# Patient Record
Sex: Male | Born: 1940
Health system: Southern US, Community
[De-identification: ages and names within clinical notes are randomized; demographics above are authoritative.]

## PROBLEM LIST (undated history)

## (undated) DIAGNOSIS — I493 Ventricular premature depolarization: Secondary | ICD-10-CM

## (undated) DIAGNOSIS — I10 Essential (primary) hypertension: Secondary | ICD-10-CM

## (undated) DIAGNOSIS — I2510 Atherosclerotic heart disease of native coronary artery without angina pectoris: Secondary | ICD-10-CM

## (undated) DIAGNOSIS — J15212 Pneumonia due to Methicillin resistant Staphylococcus aureus: Secondary | ICD-10-CM

## (undated) DIAGNOSIS — K635 Polyp of colon: Secondary | ICD-10-CM

## (undated) DIAGNOSIS — J449 Chronic obstructive pulmonary disease, unspecified: Secondary | ICD-10-CM

## (undated) DIAGNOSIS — E7800 Pure hypercholesterolemia, unspecified: Secondary | ICD-10-CM

## (undated) DIAGNOSIS — N400 Enlarged prostate without lower urinary tract symptoms: Secondary | ICD-10-CM

## (undated) HISTORY — DX: Benign prostatic hyperplasia without lower urinary tract symptoms: N40.0

## (undated) HISTORY — DX: Essential (primary) hypertension: I10

## (undated) HISTORY — DX: Pure hypercholesterolemia, unspecified: E78.00

## (undated) HISTORY — PX: CORONARY ANGIOPLASTY: SHX604

## (undated) HISTORY — DX: Ventricular premature depolarization: I49.3

## (undated) HISTORY — PX: CHOLECYSTECTOMY: SHX55

## (undated) HISTORY — DX: Chronic obstructive pulmonary disease, unspecified: J44.9

## (undated) HISTORY — DX: Atherosclerotic heart disease of native coronary artery without angina pectoris: I25.10

## (undated) HISTORY — DX: Pneumonia due to methicillin resistant Staphylococcus aureus: J15.212

## (undated) HISTORY — DX: Polyp of colon: K63.5

---

## 1999-04-23 ENCOUNTER — Other Ambulatory Visit: Admission: RE | Admit: 1999-04-23 | Discharge: 1999-04-23 | Payer: Self-pay | Admitting: Family Medicine

## 1999-06-05 ENCOUNTER — Ambulatory Visit (HOSPITAL_COMMUNITY): Admission: RE | Admit: 1999-06-05 | Discharge: 1999-06-05 | Payer: Self-pay | Admitting: Gastroenterology

## 1999-06-05 ENCOUNTER — Encounter (INDEPENDENT_AMBULATORY_CARE_PROVIDER_SITE_OTHER): Payer: Self-pay

## 2001-08-11 DIAGNOSIS — I2510 Atherosclerotic heart disease of native coronary artery without angina pectoris: Secondary | ICD-10-CM

## 2001-08-11 HISTORY — DX: Atherosclerotic heart disease of native coronary artery without angina pectoris: I25.10

## 2001-09-15 ENCOUNTER — Encounter: Payer: Self-pay | Admitting: Family Medicine

## 2001-09-15 ENCOUNTER — Encounter: Admission: RE | Admit: 2001-09-15 | Discharge: 2001-09-15 | Payer: Self-pay | Admitting: Family Medicine

## 2001-10-21 ENCOUNTER — Inpatient Hospital Stay (HOSPITAL_COMMUNITY): Admission: RE | Admit: 2001-10-21 | Discharge: 2001-10-23 | Payer: Self-pay | Admitting: *Deleted

## 2002-01-04 ENCOUNTER — Ambulatory Visit (HOSPITAL_COMMUNITY): Admission: RE | Admit: 2002-01-04 | Discharge: 2002-01-05 | Payer: Self-pay | Admitting: Cardiology

## 2002-08-02 ENCOUNTER — Inpatient Hospital Stay (HOSPITAL_COMMUNITY): Admission: AD | Admit: 2002-08-02 | Discharge: 2002-08-04 | Payer: Self-pay | Admitting: Internal Medicine

## 2002-08-02 ENCOUNTER — Encounter: Payer: Self-pay | Admitting: Internal Medicine

## 2004-04-08 ENCOUNTER — Ambulatory Visit (HOSPITAL_COMMUNITY): Admission: RE | Admit: 2004-04-08 | Discharge: 2004-04-08 | Payer: Self-pay | Admitting: Gastroenterology

## 2004-04-08 ENCOUNTER — Encounter (INDEPENDENT_AMBULATORY_CARE_PROVIDER_SITE_OTHER): Payer: Self-pay | Admitting: Specialist

## 2006-02-23 ENCOUNTER — Emergency Department (HOSPITAL_COMMUNITY): Admission: EM | Admit: 2006-02-23 | Discharge: 2006-02-24 | Payer: Self-pay | Admitting: Emergency Medicine

## 2006-06-11 ENCOUNTER — Inpatient Hospital Stay (HOSPITAL_COMMUNITY): Admission: EM | Admit: 2006-06-11 | Discharge: 2006-06-13 | Payer: Self-pay | Admitting: Emergency Medicine

## 2006-06-12 ENCOUNTER — Encounter (INDEPENDENT_AMBULATORY_CARE_PROVIDER_SITE_OTHER): Payer: Self-pay | Admitting: *Deleted

## 2007-04-06 ENCOUNTER — Ambulatory Visit: Payer: Self-pay | Admitting: Pulmonary Disease

## 2007-04-22 ENCOUNTER — Ambulatory Visit: Payer: Self-pay | Admitting: Internal Medicine

## 2008-10-15 ENCOUNTER — Inpatient Hospital Stay (HOSPITAL_COMMUNITY): Admission: EM | Admit: 2008-10-15 | Discharge: 2008-10-17 | Payer: Self-pay | Admitting: Emergency Medicine

## 2009-10-09 DIAGNOSIS — J15212 Pneumonia due to Methicillin resistant Staphylococcus aureus: Secondary | ICD-10-CM

## 2009-10-09 HISTORY — DX: Pneumonia due to methicillin resistant Staphylococcus aureus: J15.212

## 2009-10-16 ENCOUNTER — Inpatient Hospital Stay (HOSPITAL_COMMUNITY): Admission: EM | Admit: 2009-10-16 | Discharge: 2009-10-23 | Payer: Self-pay | Admitting: Emergency Medicine

## 2009-10-26 ENCOUNTER — Encounter: Admission: RE | Admit: 2009-10-26 | Discharge: 2009-10-26 | Payer: Self-pay | Admitting: Family Medicine

## 2009-10-30 ENCOUNTER — Inpatient Hospital Stay (HOSPITAL_COMMUNITY): Admission: EM | Admit: 2009-10-30 | Discharge: 2009-11-07 | Payer: Self-pay | Admitting: Emergency Medicine

## 2009-10-30 ENCOUNTER — Ambulatory Visit: Payer: Self-pay | Admitting: Internal Medicine

## 2009-10-31 ENCOUNTER — Encounter (INDEPENDENT_AMBULATORY_CARE_PROVIDER_SITE_OTHER): Payer: Self-pay | Admitting: Internal Medicine

## 2009-11-12 ENCOUNTER — Telehealth: Payer: Self-pay | Admitting: Pulmonary Disease

## 2009-11-12 ENCOUNTER — Ambulatory Visit: Payer: Self-pay | Admitting: Pulmonary Disease

## 2009-11-12 ENCOUNTER — Inpatient Hospital Stay (HOSPITAL_COMMUNITY): Admission: AD | Admit: 2009-11-12 | Discharge: 2009-11-24 | Payer: Self-pay | Admitting: Pulmonary Disease

## 2009-11-12 DIAGNOSIS — J930 Spontaneous tension pneumothorax: Secondary | ICD-10-CM

## 2009-11-12 DIAGNOSIS — J939 Pneumothorax, unspecified: Secondary | ICD-10-CM | POA: Insufficient documentation

## 2009-11-12 DIAGNOSIS — F17200 Nicotine dependence, unspecified, uncomplicated: Secondary | ICD-10-CM | POA: Insufficient documentation

## 2009-11-12 DIAGNOSIS — Z9981 Dependence on supplemental oxygen: Secondary | ICD-10-CM

## 2009-11-12 DIAGNOSIS — J1520 Pneumonia due to staphylococcus, unspecified: Secondary | ICD-10-CM | POA: Insufficient documentation

## 2009-11-12 DIAGNOSIS — J441 Chronic obstructive pulmonary disease with (acute) exacerbation: Secondary | ICD-10-CM | POA: Insufficient documentation

## 2009-11-14 ENCOUNTER — Ambulatory Visit: Payer: Self-pay | Admitting: Thoracic Surgery

## 2009-11-15 ENCOUNTER — Encounter: Payer: Self-pay | Admitting: Pulmonary Disease

## 2009-11-21 ENCOUNTER — Encounter: Payer: Self-pay | Admitting: Pulmonary Disease

## 2009-11-23 ENCOUNTER — Telehealth: Payer: Self-pay | Admitting: Pulmonary Disease

## 2009-11-29 ENCOUNTER — Encounter: Payer: Self-pay | Admitting: Pulmonary Disease

## 2009-11-29 ENCOUNTER — Ambulatory Visit: Payer: Self-pay | Admitting: Pulmonary Disease

## 2009-12-03 ENCOUNTER — Telehealth (INDEPENDENT_AMBULATORY_CARE_PROVIDER_SITE_OTHER): Payer: Self-pay | Admitting: *Deleted

## 2009-12-07 ENCOUNTER — Telehealth (INDEPENDENT_AMBULATORY_CARE_PROVIDER_SITE_OTHER): Payer: Self-pay | Admitting: *Deleted

## 2009-12-13 ENCOUNTER — Ambulatory Visit: Payer: Self-pay | Admitting: Pulmonary Disease

## 2009-12-28 ENCOUNTER — Ambulatory Visit: Payer: Self-pay | Admitting: Pulmonary Disease

## 2010-01-28 ENCOUNTER — Ambulatory Visit: Payer: Self-pay | Admitting: Pulmonary Disease

## 2010-01-28 ENCOUNTER — Encounter: Payer: Self-pay | Admitting: Pulmonary Disease

## 2010-01-29 ENCOUNTER — Telehealth (INDEPENDENT_AMBULATORY_CARE_PROVIDER_SITE_OTHER): Payer: Self-pay | Admitting: *Deleted

## 2010-02-25 ENCOUNTER — Ambulatory Visit: Payer: Self-pay | Admitting: Pulmonary Disease

## 2010-03-01 ENCOUNTER — Encounter: Payer: Self-pay | Admitting: Pulmonary Disease

## 2010-04-25 ENCOUNTER — Ambulatory Visit: Payer: Self-pay | Admitting: Pulmonary Disease

## 2010-06-24 ENCOUNTER — Ambulatory Visit: Payer: Self-pay | Admitting: Pulmonary Disease

## 2010-09-12 NOTE — Assessment & Plan Note (Signed)
Summary: 4 weeks/apc   Visit Type:  Follow-up Primary Provider/Referring Provider:  Ehinger  CC:  follow up, soB with activity, and pt states he has been having a low grade fever of 99.7.  History of Present Illness: 70 year old Caucasian male with history of coronary artery  disease who was  -readmitted to the hospital on March 22, 2011for persistent fever and cough.  Initially  discharged from the hospital a week prior to this admission with MRSA pneumonia.  Adm 3/22 -3/30/ 11 for Right upper lobe pneumonia (methicillin-resistant Staphylococcus  aureus).  MRSA 3/24 in sputum was S to clinda/ bactrim/ gent/ rifampin  Readm 4/4 -4/16 for rt pneumothorax & persistent air leak  November 29, 2009 1:39 PM  sutures removed. dyspnea at baseline. prefers his regimen of atrovent/ flovent & albuterol nebs to spiriva. CXR shows some improvement in RUl infx. Ct chest reviewed . compliant with O2  Dec 28, 2009 3:35 PM  better, temp as high as 99, clear phlegm, able to stay off oxygen for periods, chest tube site has healed.  Current Medications (verified): 1)  Atrovent Hfa 17 Mcg/act Aers (Ipratropium Bromide Hfa) .... Three Puffs Two Times A Day 2)  Flovent Hfa 44 Mcg/act Aero (Fluticasone Propionate  Hfa) .... Inhale 2 Puffs Two Times A Day 3)  Prevacid 15 Mg Cpdr (Lansoprazole) .... Take 1 Tablet By Mouth Once A Day (As Needed) 4)  Vytorin 10-40 Mg Tabs (Ezetimibe-Simvastatin) .... Take 1 Tablet By Mouth Once A Day 5)  Albuterol Sulfate (2.5 Mg/65ml) 0.083% Nebu (Albuterol Sulfate) .... Every 6 Hours As Needed  Allergies: 1)  ! Codeine  Past History:  Past Medical History: Last updated: 11/12/2009 1. CAD status post stenting in 2003.   2. Hypertension.   3. COPD.   4. History of cigarette smoking.   5. Hyperlipidemia  Past Surgical History: Last updated: 11/12/2009 Cholecystectomy  Social History: Last updated: 11/29/2009 Patient states former smoker.   Review of Systems    The patient complains of dyspnea on exertion.  The patient denies anorexia, fever, weight loss, weight gain, vision loss, decreased hearing, hoarseness, chest pain, syncope, peripheral edema, prolonged cough, headaches, hemoptysis, abdominal pain, melena, hematochezia, severe indigestion/heartburn, hematuria, muscle weakness, suspicious skin lesions, difficulty walking, depression, unusual weight change, and abnormal bleeding.    Vital Signs:  Patient profile:   70 year old male Height:      65.5 inches Weight:      124 pounds O2 Sat:      94 % on Room air Temp:     98.3 degrees F Pulse rate:   85 / minute BP sitting:   100 / 58  (left arm) Cuff size:   regular  Vitals Entered By: Mindy Silva(Dec 28, 2009 3:30 PM)  O2 Flow:  Room air  Serial Vital Signs/Assessments:  Comments: Ambulatory Pulse Oximetry  Resting; HR__92___    02 Sat__95%RA___  Lap1 (185 feet)   HR_112____   02 Sat__96%RA___ Lap2 (185 feet)   HR__114___   02 Sat__93%RA___    Lap3 (185 feet)   HR_110____   02 Sat__92%RA___  _X__Test Completed without Difficulty ___Test Stopped due to: Carver Fila SMA  Dec 28, 2009 3:55 PM       By: Carron Curie CMA   CC: follow up, soB with activity, pt states he has been having a low grade fever of 99.7 Comments Meds and allergies updated Carver Fila SMA  Dec 28, 2009 3:30 PM    Physical Exam  Additional  Exam:  Gen. Pleasant,thin, in no distress, normal affect ENT - no lesions, no post nasal drip Neck: No JVD, no thyromegaly, no carotid bruits Lungs: no use of accessory muscles, no dullness to percussion, decreased without rales or rhonchi  Cardiovascular: Rhythm regular, heart sounds  normal, no murmurs or gallops, no peripheral edema Musculoskeletal: No deformities, no cyanosis, clubbing ++      CXR  Procedure date:  12/28/2009  Findings:      IMPRESSION:   1.  Some decrease in airspace opacity in the right lower lobe. Right lung is otherwise  unchanged appearance. 2.  Emphysema. 3.  No new abnormality.  Impression & Recommendations:  Problem # 1:  SPONTANEOUS PNEUMOTHORAX (ICD-512.8)  -resolved  Orders: Est. Patient Level IV (56433)  Problem # 2:  MRSA PNEUMONIA (ICD-482.40)  - FU serial CXR q month to resolution OK to observe off abx now The following medications were removed from the medication list:    Zyvox 600 Mg Tabs (Linezolid) .Marland Kitchen... Take 1 tablet by mouth two times a day  Orders: Est. Patient Level IV (29518)  Problem # 3:  C O P D (ICD-496) ct flovent / atrovent decrease albuterlol neb usage  Problem # 4:  OXYGEN-USE OF SUPPLEMENTAL (ICD-V46.2)  dc daytime, ct nocturnal use  Orders: DME Referral (DME) Est. Patient Level IV (84166)  Medications Added to Medication List This Visit: 1)  Prevacid 15 Mg Cpdr (Lansoprazole) .... Take 1 tablet by mouth once a day (as needed)  Other Orders: T-2 View CXR (71020TC)  Patient Instructions: 1)  Copy sent to: Dr Manus Gunning 2)  Please schedule a follow-up appointment in 1 month with TP & chest x ray 3)  Get back on baby aspirin 4)  OK to stay off oxygen during daytime - continue to sleep with this at night

## 2010-09-12 NOTE — Procedures (Signed)
Summary: Oximetry/[facility]  Oximetry/[facility]   Imported By: Sherian Rein 03/08/2010 10:32:13  _____________________________________________________________________  External Attachment:    Type:   Image     Comment:   External Document

## 2010-09-12 NOTE — Assessment & Plan Note (Signed)
Summary: sob/jd   Visit Type:  Hospital Follow-up Primary Provider/Referring Provider:  Ehinger  CC:  Pt here for hospital follow up.  History of Present Illness: 70 year old Caucasian male with history of coronary artery  disease who was readmitted to the hospital on October 30, 2009, with  history of persistent fever and cough.  Patient had earlier been   discharged from the hospital a week prior to this admission after being  treated for MRSA pneumonia.  He was also said to have been given Bactrim  and Levaquin which patient was said to have used at home.  He, however,  represented with persistent cough and fever.  Fever was above 103   degrees and cough was said to be productive of rusty sputum.   Adm 3/22 -3/30/ 11 for Right upper lobe pneumonia (methicillin-resistant Staphylococcus  aureus).  MRSA 3/24 in sputum was S to clinda/ bactrim/ gent/ rifampin Steroid-induced hyperglycemia  chest on 3/22 showed 1. Worsening right upper lobe cavitary pneumonia with intra  parenchymal abscess formation as described.  2.  New relatively mild involvement of the lingula. Right middle  lobe infiltrate appears stable.  3.  Interval enlargement of small right pleural effusion.  4.  Stable probable reactive lymph nodes in the mediastinum and  right hilum.  November 12, 2009 3:28 PM c/o increased dyspne asince last night No more hemoptysis, Has quit smoking !! c/o increased dyspnea overnight & tremors. Using albuterol nebs q 6h, c/o dry mouth - Accompnaied by wife & dauughter, satn 93%RA, zyvox was expensive but able to afford  Current Medications (verified): 1)  Atrovent Hfa 17 Mcg/act Aers (Ipratropium Bromide Hfa) .... Three Puffs Two Times A Day 2)  Flovent Hfa 44 Mcg/act Aero (Fluticasone Propionate  Hfa) .... Inhale 2 Puffs Two Times A Day 3)  Prevacid 15 Mg Cpdr (Lansoprazole) .... Take 1 Tablet By Mouth Once A Day 4)  Vytorin 10-40 Mg Tabs (Ezetimibe-Simvastatin) .... Take 1 Tablet By Mouth Once A  Day 5)  Diflucan 100 Mg Tabs (Fluconazole) .... Take 1 Tablet By Mouth Once A Day 6)  Albuterol Sulfate (2.5 Mg/21ml) 0.083% Nebu (Albuterol Sulfate) .... Every 6 Hours As Needed 7)  Zyvox 600 Mg Tabs (Linezolid) .... Take 1 Tablet By Mouth Two Times A Day  Allergies (verified): 1)  ! Codeine  Past History:  Past Medical History: 1. CAD status post stenting in 2003.   2. Hypertension.   3. COPD.   4. History of cigarette smoking.   5. Hyperlipidemia  Past Surgical History: Cholecystectomy  Review of Systems       The patient complains of dyspnea on exertion.  The patient denies anorexia, fever, weight loss, weight gain, vision loss, decreased hearing, hoarseness, chest pain, syncope, peripheral edema, prolonged cough, headaches, hemoptysis, abdominal pain, melena, hematochezia, severe indigestion/heartburn, hematuria, muscle weakness, suspicious skin lesions, difficulty walking, depression, unusual weight change, and abnormal bleeding.    Vital Signs:  Patient profile:   70 year old male Height:      65.5 inches Weight:      117.13 pounds BMI:     19.26 O2 Sat:      95 % on 3 L/min Temp:     97.7 degrees F oral Pulse rate:   127 / minute BP sitting:   124 / 66  (left arm) Cuff size:   regular  Vitals Entered By: Zackery Barefoot CMA (November 12, 2009 2:53 PM)  O2 Flow:  3 L/min CC: Pt here  for hospital follow up Comments Medications reviewed with patient Verified contact number and pharmacy with patient Zackery Barefoot CMA  November 12, 2009 2:54 PM    Physical Exam  Additional Exam:  Gen. Pleasant,thin, in no distress, normal affect ENT - no lesions, no post nasal drip Neck: No JVD, no thyromegaly, no carotid bruits Lungs: no use of accessory muscles, no dullness to percussion, decreased without rales or rhonchi  Cardiovascular: Rhythm regular, heart sounds  normal, no murmurs or gallops, no peripheral edema Abdomen: soft and non-tender, no hepatosplenomegaly, BS  normal. Musculoskeletal: No deformities, no cyanosis, clubbing ++ Neuro:  alert, non focal, tremors +     CXR  Procedure date:  11/12/2009  Findings:      IMPRESSION:   1.  30% new right pneumothorax. 2.  Right pleural fluid with airspace opacity in the right upper lobe and right lower lobe. I suspect loculated fluid along the right lung apex.  Impression & Recommendations:  Problem # 1:  MRSA PNEUMONIA (ICD-482.40) Will treat with zyvox x 2 wks , got vanc x 1 wk in hospital ,  If cxr better , can then use clinda until resolution on CXR Discussed significance & prognosis with daughter His updated medication list for this problem includes:    Zyvox 600 Mg Tabs (Linezolid) .Marland Kitchen... Take 1 tablet by mouth two times a day  Orders: Est. Patient Level IV (99214) T-2 View CXR (71020TC)  Problem # 2:  TOBACCO ABUSE (ICD-305.1) nicotine patch - he prefers to inhaler Orders: Est. Patient Level IV (28413)  Problem # 3:  C O P D (ICD-496) ok to dc steroids spiriva in  future  Problem # 4:  OXYGEN-USE OF SUPPLEMENTAL (ICD-V46.2) ct nocturnal O2 & on exertion Orders: Est. Patient Level IV (24401)  Problem # 5:  SPONTANEOUS PNEUMOTHORAX (ICD-512.8) Admit for observation. If FU CXR shows worsening , will need chest tube Was able to convince him to get admitted after explaining consequences.  Medications Added to Medication List This Visit: 1)  Atrovent Hfa 17 Mcg/act Aers (Ipratropium bromide hfa) .... Three puffs two times a day 2)  Flovent Hfa 44 Mcg/act Aero (Fluticasone propionate  hfa) .... Inhale 2 puffs two times a day 3)  Prevacid 15 Mg Cpdr (Lansoprazole) .... Take 1 tablet by mouth once a day 4)  Vytorin 10-40 Mg Tabs (Ezetimibe-simvastatin) .... Take 1 tablet by mouth once a day 5)  Diflucan 100 Mg Tabs (Fluconazole) .... Take 1 tablet by mouth once a day 6)  Albuterol Sulfate (2.5 Mg/77ml) 0.083% Nebu (Albuterol sulfate) .... Every 6 hours as needed 7)  Zyvox 600 Mg  Tabs (Linezolid) .... Take 1 tablet by mouth two times a day  Patient Instructions: 1)  A chest x-ray has been recommended.  Your imaging study may require preauthorization.  2)  Copy sent to: 3)  Please schedule a follow-up appointment in 9 days with TP with CXR 4)  Use nicotine patch 14 - 21 mg once daily  5)  Use albuterol three times a day & a 4th time if needed 6)  OK to stay of prednisone 7)  You will likely need a longer duration of antibiotics

## 2010-09-12 NOTE — Progress Notes (Signed)
  Phone Note Outgoing Call   Call placed by: Comer Locket. Vassie Loll MD,  November 23, 2009 9:48 AM Summary of Call: received [facility] letter - saying they ccanot complete review of reason for admission. pleasae fax the last note/ admission H & P & XRay report to them at [phone] Initial call taken by: Comer Locket. Vassie Loll MD,  November 23, 2009 9:49 AM  Follow-up for Phone Call        Faxed. Zackery Barefoot CMA  November 23, 2009 5:28 PM

## 2010-09-12 NOTE — Miscellaneous (Signed)
Summary: Orders Update  Clinical Lists Changes  Orders: Added new Test order of T-2 View CXR (71020TC) - Signed 

## 2010-09-12 NOTE — Assessment & Plan Note (Signed)
Summary: f/u 2 months///kp   Visit Type:  Initial Consult Primary Provider/Referring Provider:  Ehinger  CC:  2 month follow up. Pt c/o increased SOB all  the time, non-productive cough, and chest pain and tightness.  History of Present Illness: 68/M with CAD for FU of COPD  Adm 3/22 -3/30/ 11 for Right upper lobe pneumonia (methicillin-resistant Staphylococcus  aureus).  MRSA 3/24 in sputum was S to clinda/ bactrim/ gent/ rifampin Readm 4/4 -4/16 for rt pneumothorax & persistent air leak -good resolution on FU imaging  June 24, 2010 10:58 AM  Quit smoking since feb '11. His wife & daughter continue to smoke chest tightness, not related to exertion, spont resolves in 4 mins, needs albuterol neb two times a day as needed , dr turner checked ekg  Denies chest pain, orthopnea, hemoptysis, fever, n/v/d, edema, headache,recent travel.   Preventive Screening-Counseling & Management  Alcohol-Tobacco     Smoking Status: quit     Packs/Day: 0.75     Year Started: 1961     Year Quit: 2011     Pack years: 55  Current Medications (verified): 1)  Atrovent Hfa 17 Mcg/act Aers (Ipratropium Bromide Hfa) .... Three Puffs Two Times A Day 2)  Flovent Hfa 44 Mcg/act Aero (Fluticasone Propionate  Hfa) .... Inhale 2 Puffs Two Times A Day 3)  Prevacid 15 Mg Cpdr (Lansoprazole) .... Take 1 Tablet By Mouth Once A Day (As Needed) 4)  Vytorin 10-40 Mg Tabs (Ezetimibe-Simvastatin) .... Take 1 Tablet By Mouth Once A Day 5)  Albuterol Sulfate (2.5 Mg/37ml) 0.083% Nebu (Albuterol Sulfate) .... Every 6 Hours As Needed 6)  Aspirin 81 Mg Tbec (Aspirin) .... Take 1 Tablet By Mouth Once A Day  Allergies (verified): 1)  ! Codeine  Past History:  Past Medical History: Last updated: 11/12/2009 1. CAD status post stenting in 2003.   2. Hypertension.   3. COPD.   4. History of cigarette smoking.   5. Hyperlipidemia  Social History: Last updated: 01/28/2010 former smoker, quit 10-09-2009 - x52yrs,  <1ppd no alcohol married 4 children retired: Facilities manager at [employer]  Review of Systems       The patient complains of dyspnea on exertion.  The patient denies anorexia, fever, weight loss, weight gain, vision loss, decreased hearing, hoarseness, chest pain, syncope, peripheral edema, prolonged cough, headaches, hemoptysis, abdominal pain, melena, hematochezia, severe indigestion/heartburn, hematuria, muscle weakness, suspicious skin lesions, transient blindness, difficulty walking, depression, unusual weight change, abnormal bleeding, enlarged lymph nodes, and angioedema.    Vital Signs:  Patient profile:   70 year old male Height:      65.5 inches Weight:      137.4 pounds BMI:     22.60 O2 Sat:      96 % on Room air Temp:     98.0 degrees F oral Pulse rate:   100 / minute BP sitting:   112 / 60  (left arm) Cuff size:   regular  Vitals Entered By: Zackery Barefoot CMA (June 24, 2010 10:40 AM)  O2 Flow:  Room air CC: 2 month follow up. Pt c/o increased SOB all  the time, non-productive cough, chest pain and tightness Comments Medications reviewed with patient Verified contact number and pharmacy with patient Zackery Barefoot CMA  June 24, 2010 10:40 AM    Physical Exam  Additional Exam:  wt 137 June 25, 2010  Gen. Pleasant,thin, in no distress, normal affect ENT - no lesions, no post nasal drip Neck:  No JVD, no thyromegaly, no carotid bruits Lungs: coarse BS w/ no wheezing.  Cardiovascular: Rhythm regular, heart sounds  normal, no murmurs or gallops, no peripheral edema Musculoskeletal: No deformities, no cyanosis, clubbing ++      Impression & Recommendations:  Problem # 1:  MRSA PNEUMONIA (ICD-482.40)  Appears resolved on last CXR with scarring  Orders: Est. Patient Level III (16109)  Problem # 2:  C O P D (ICD-496) stable  Trial of spiriva He does not want to strat pulm rehab  Patient Instructions: 1)  Copy sent to: dr  Manus Gunning 2)  Please schedule a follow-up appointment in 4 months with TP 3)  trial of spiriva qd   - USE instead of atrovent  - call me in 10 days & let me know if this is working 4)  Flu shot  5)  You would benefit from a pulmonary rehab program as we discussed. let me know if you a re interested

## 2010-09-12 NOTE — Letter (Signed)
Summary: Request for medical records/[facility]  Request for medical records/[facility]   Imported By: Sherian Rein 12/04/2009 10:36:30  _____________________________________________________________________  External Attachment:    Type:   Image     Comment:   External Document

## 2010-09-12 NOTE — Assessment & Plan Note (Signed)
Summary: NP follow up - PNA   Primary Provider/Referring Provider:  Manus Gunning  CC:  1 month follow up w/ cxr - states no new complaints today.Marland Kitchen  History of Present Illness: 70 year old Caucasian male with history of coronary artery  disease who was  -readmitted to the hospital on March 22, 2011for persistent fever and cough.  Initially  discharged from the hospital a week prior to this admission with MRSA pneumonia.  Adm 3/22 -3/30/ 11 for Right upper lobe pneumonia (methicillin-resistant Staphylococcus  aureus).  MRSA 3/24 in sputum was S to clinda/ bactrim/ gent/ rifampin  Readm 4/4 -4/16 for rt pneumothorax & persistent air leak  November 29, 2009 1:39 PM  sutures removed. dyspnea at baseline. prefers his regimen of atrovent/ flovent & albuterol nebs to spiriva. CXR shows some improvement in RUl infx. Ct chest reviewed . compliant with O2  Dec 28, 2009 3:35 PM  better, temp as high as 99, clear phlegm, able to stay off oxygen for periods, chest tube site has healed.  January 28, 2010--Returns for 1 month follow up w/ cxr. He feeling much better, he is getting stronger, close to his basline. Cough and congestion are almost totally gone.  CXR today shows a gradual decrease in size of RUL opacity but not totally resolved. His appetitie has improved and weight is slowly going up. Denies chest pain,  orthopnea, hemoptysis, fever, n/v/d, edema, headache.   Medications Prior to Update: 1)  Atrovent Hfa 17 Mcg/act Aers (Ipratropium Bromide Hfa) .... Three Puffs Two Times A Day 2)  Flovent Hfa 44 Mcg/act Aero (Fluticasone Propionate  Hfa) .... Inhale 2 Puffs Two Times A Day 3)  Prevacid 15 Mg Cpdr (Lansoprazole) .... Take 1 Tablet By Mouth Once A Day (As Needed) 4)  Vytorin 10-40 Mg Tabs (Ezetimibe-Simvastatin) .... Take 1 Tablet By Mouth Once A Day 5)  Albuterol Sulfate (2.5 Mg/49ml) 0.083% Nebu (Albuterol Sulfate) .... Every 6 Hours As Needed  Current Medications (verified): 1)  Atrovent Hfa 17  Mcg/act Aers (Ipratropium Bromide Hfa) .... Three Puffs Two Times A Day 2)  Flovent Hfa 44 Mcg/act Aero (Fluticasone Propionate  Hfa) .... Inhale 2 Puffs Two Times A Day 3)  Prevacid 15 Mg Cpdr (Lansoprazole) .... Take 1 Tablet By Mouth Once A Day (As Needed) 4)  Vytorin 10-40 Mg Tabs (Ezetimibe-Simvastatin) .... Take 1 Tablet By Mouth Once A Day 5)  Albuterol Sulfate (2.5 Mg/10ml) 0.083% Nebu (Albuterol Sulfate) .... Every 6 Hours As Needed 6)  Aspirin 81 Mg Tbec (Aspirin) .... Take 1 Tablet By Mouth Once A Day  Allergies (verified): 1)  ! Codeine  Past History:  Past Medical History: Last updated: 11/12/2009 1. CAD status post stenting in 2003.   2. Hypertension.   3. COPD.   4. History of cigarette smoking.   5. 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  Risk Factors: Smoking Status: quit (11/29/2009)  Family History: asthma - father rheumatism - father cancer - mother (breast)  Social History: former smoker, quit 10-09-2009 - x93yrs, <1ppd no alcohol married 4 children retired: Facilities manager at a [employer]  Review of Systems      See HPI  Vital Signs:  Patient profile:   70 year old male Height:  65.5 inches Weight:      128 pounds BMI:     21.05 O2 Sat:      96 % on Room air Temp:     98.2 degrees F oral Pulse rate:   75 / minute BP sitting:   106 / 60  (left arm) Cuff size:   regular  Vitals Entered By: Boone Master CNA/MA (January 28, 2010 3:27 PM)  O2 Flow:  Room air CC: 1 month follow up w/ cxr - states no new complaints today.   Physical Exam  Additional Exam:  Gen. Pleasant,thin, in no distress, normal affect ENT - no  lesions, no post nasal drip Neck: No JVD, no thyromegaly, no carotid bruits Lungs: no use of accessory muscles, no dullness to percussion, decreased without rales or rhonchi  Cardiovascular: Rhythm regular, heart sounds  normal, no murmurs or gallops, no peripheral edema Musculoskeletal: No deformities, no cyanosis, clubbing ++      Impression & Recommendations:  Problem # 1:  MRSA PNEUMONIA (ICD-482.40)  Clinically he is much improved and compensated on present regimen.  Xray w/ slow gradual decrease in RUL opacity, will need to follow up till clearance Xray reviewed by Dr. Vassie Loll , pt w/ follow up w/ him next month and repeat xray.   Orders: Est. Patient Level IV (14782)  Medications Added to Medication List This Visit: 1)  Aspirin 81 Mg Tbec (Aspirin) .... Take 1 tablet by mouth once a day  Complete Medication List: 1)  Atrovent Hfa 17 Mcg/act Aers (Ipratropium bromide hfa) .... Three puffs two times a day 2)  Flovent Hfa 44 Mcg/act Aero (Fluticasone propionate  hfa) .... Inhale 2 puffs two times a day 3)  Prevacid 15 Mg Cpdr (Lansoprazole) .... Take 1 tablet by mouth once a day (as needed) 4)  Vytorin 10-40 Mg Tabs (Ezetimibe-simvastatin) .... Take 1 tablet by mouth once a day 5)  Albuterol Sulfate (2.5 Mg/109ml) 0.083% Nebu (Albuterol sulfate) .... Every 6 hours as needed 6)  Aspirin 81 Mg Tbec (Aspirin) .... Take 1 tablet by mouth once a day  Patient Instructions: 1)  Continue on same meds.  2)  I will call with xray results.  3)  follow up Dr. Vassie Loll in 1 month and as needed    Immunization History:  Influenza Immunization History:    Influenza:  historical (05/12/2007)  Pneumovax Immunization History:    Pneumovax:  historical (05/12/2007)

## 2010-09-12 NOTE — Assessment & Plan Note (Signed)
Summary: per pete/post hospital/need chest xray/mhh   Primary Provider/Referring Provider:  Manus Gunning  CC:  Pt is here for a post hosp f/u appt.  Pt states breathing is "good."  Pt states occ cough with small amounts of "rust colored sputum."  .  History of Present Illness: 70 year old Caucasian male with history of coronary artery  disease who was  -readmitted to the hospital on March 22, 2011for persistent fever and cough.  Initially  discharged from the hospital a week prior to this admission with MRSA pneumonia.  Adm 3/22 -3/30/ 11 for Right upper lobe pneumonia (methicillin-resistant Staphylococcus  aureus).  MRSA 3/24 in sputum was S to clinda/ bactrim/ gent/ rifampin  Readm 4/4 -4/16 for rt pneumothorax & persistent air leak  November 29, 2009 1:39 PM  sutures removed. dyspnea at baseline. prefers his regimen of atrovent/ flovent & albuterol nebs to spiriva. CXR shows some improvement in RUl infx. Ct chest reviewed . compliant with O2  Preventive Screening-Counseling & Management  Alcohol-Tobacco     Smoking Status: quit  Current Medications (verified): 1)  Atrovent Hfa 17 Mcg/act Aers (Ipratropium Bromide Hfa) .... Three Puffs Two Times A Day 2)  Flovent Hfa 44 Mcg/act Aero (Fluticasone Propionate  Hfa) .... Inhale 2 Puffs Two Times A Day 3)  Prevacid 15 Mg Cpdr (Lansoprazole) .... Take 1 Tablet By Mouth Once A Day 4)  Vytorin 10-40 Mg Tabs (Ezetimibe-Simvastatin) .... Take 1 Tablet By Mouth Once A Day 5)  Albuterol Sulfate (2.5 Mg/21ml) 0.083% Nebu (Albuterol Sulfate) .... Every 6 Hours As Needed 6)  Zyvox 600 Mg Tabs (Linezolid) .... Take 1 Tablet By Mouth Two Times A Day  Allergies (verified): 1)  ! Codeine  Past History:  Past Medical History: Last updated: 11/12/2009 1. CAD status post stenting in 2003.   2. Hypertension.   3. COPD.   4. History of cigarette smoking.   5. Hyperlipidemia  Social History: Last updated: 11/29/2009 Patient states former smoker.    Social History: Patient states former smoker.  Smoking Status:  quit  Review of Systems       The patient complains of dyspnea on exertion.  The patient denies anorexia, fever, weight loss, weight gain, vision loss, decreased hearing, hoarseness, chest pain, syncope, peripheral edema, prolonged cough, headaches, hemoptysis, hematuria, muscle weakness, suspicious skin lesions, transient blindness, difficulty walking, depression, unusual weight change, abnormal bleeding, enlarged lymph nodes, angioedema, breast masses, and testicular masses.    Vital Signs:  Patient profile:   70 year old male Height:      65.5 inches Weight:      117.13 pounds O2 Sat:      95 % on 2 LPM pulsed Temp:     98.2 degrees F oral Pulse rate:   130 / minute BP sitting:   112 / 52  (left arm) Cuff size:   regular  Vitals Entered By: Arman Filter LPN (November 29, 2009 1:22 PM)  O2 Flow:  2 LPM pulsed CC: Pt is here for a post hosp f/u appt.  Pt states breathing is "good."  Pt states occ cough with small amounts of "rust colored sputum."   Comments Medications reviewed with patient Arman Filter LPN  November 29, 2009 1:22 PM    Physical Exam  Additional Exam:  Gen. Pleasant,thin, in no distress, normal affect ENT - no lesions, no post nasal drip Neck: No JVD, no thyromegaly, no carotid bruits Lungs: no use of accessory muscles, no dullness to percussion, decreased without rales or  rhonchi  Cardiovascular: Rhythm regular, heart sounds  normal, no murmurs or gallops, no peripheral edema Musculoskeletal: No deformities, no cyanosis, clubbing ++      Impression & Recommendations:  Problem # 1:  SPONTANEOUS PNEUMOTHORAX (ICD-512.8)  -resolved  Orders: Est. Patient Level IV (14782)  Problem # 2:  MRSA PNEUMONIA (ICD-482.40)  Has receive more than 6 wks total of mRSA therapy. Will stop in 1 wk & observe FU CXR  q 2 weeks until resolution - if worse or fevers, then use clinda for extended  duration His updated medication list for this problem includes:    Zyvox 600 Mg Tabs (Linezolid) .Marland Kitchen... Take 1 tablet by mouth two times a day  Orders: Est. Patient Level IV (95621)  Patient Instructions: 1)  Copy sent to: Dr Edwyna Shell, Dr Manus Gunning 2)  Please schedule a follow-up appointment in 1 month. 3)  A chest x-ray has been recommended in 2 weeks. Your imaging study may require preauthorization.  4)  OK to stop antibiotic when done

## 2010-09-12 NOTE — Progress Notes (Signed)
Summary: results  Phone Note Call from Patient   Caller: Patient Call For: parrett Summary of Call: calling for xray results Initial call taken by: Rickard Patience,  January 29, 2010 9:53 AM  Follow-up for Phone Call        lm with family member to call back Philipp Deputy [facility]  January 29, 2010 10:20 AM   per 01-29-10 append to to 01-28-10 cxr, pt advised of cxr results and verbalized his understanding.  pt has appt w/ RA 02-25-10 and will keep that appt. Boone Master CNA/MA  January 29, 2010 10:45 AM

## 2010-09-12 NOTE — Assessment & Plan Note (Signed)
Summary: NP follow up - PNA, COPD   Primary Provider/Referring Provider:  Ehinger  CC:  increased SOB, wheezing, prod cough with green/yellow mucus, sinus pressure/congestion w/ PND, and HA x2weeks - denies f/c/s.  History of Present Illness: 70 year old Caucasian male with history of coronary artery  disease who was  -readmitted to the hospital on March 22, 2011for persistent fever and cough.  Initially  discharged from the hospital a week prior to this admission with MRSA pneumonia.  Adm 3/22 -3/30/ 11 for Right upper lobe pneumonia (methicillin-resistant Staphylococcus  aureus).  MRSA 3/24 in sputum was S to clinda/ bactrim/ gent/ rifampin  Readm 4/4 -4/16 for rt pneumothorax & persistent air leak  February 25, 2010 3:02 PM  -Returns for 1 month follow . He feeling much better, he is getting stronger, close to his basline.  Off O2 . Cough and congestion are almost totally gone.  CXR 6/11 shows a gradual decrease in size of RUL opacity but not totally resolved. His appetitie has improved and weight is slowly going up. He  prefers his regimen of atrovent/ flovent & albuterol nebs to spiriva. Remains off cigs. Desaturated to 92% on walking 3 laps today CXR -scarring RUL, continues to shrink  April 25, 2010 --Returns for follow up and xray. Also complains of increased SOB, wheezing, prod cough with green/yellow mucus, sinus pressure/congestion w/ PND, HA x2weeks . OTC not helping.  was doing better until last couple of weeks. Denies chest pain, orthopnea, hemoptysis, fever, n/v/d, edema, headache,recent travel. XRay today shows RUL scarring is unchanged. no acute changes.   Anticoagulation Management History:      Positive risk factors for bleeding include an age of 46 years or older.  The bleeding index is 'intermediate risk'.  Negative CHADS2 values include Age > 19 years old.    Medications Prior to Update: 1)  Atrovent Hfa 17 Mcg/act Aers (Ipratropium Bromide Hfa) .... Three Puffs Two  Times A Day 2)  Flovent Hfa 44 Mcg/act Aero (Fluticasone Propionate  Hfa) .... Inhale 2 Puffs Two Times A Day 3)  Prevacid 15 Mg Cpdr (Lansoprazole) .... Take 1 Tablet By Mouth Once A Day (As Needed) 4)  Vytorin 10-40 Mg Tabs (Ezetimibe-Simvastatin) .... Take 1 Tablet By Mouth Once A Day 5)  Albuterol Sulfate (2.5 Mg/91ml) 0.083% Nebu (Albuterol Sulfate) .... Every 6 Hours As Needed 6)  Aspirin 81 Mg Tbec (Aspirin) .... Take 1 Tablet By Mouth Once A Day  Current Medications (verified): 1)  Atrovent Hfa 17 Mcg/act Aers (Ipratropium Bromide Hfa) .... Three Puffs Two Times A Day 2)  Flovent Hfa 44 Mcg/act Aero (Fluticasone Propionate  Hfa) .... Inhale 2 Puffs Two Times A Day 3)  Prevacid 15 Mg Cpdr (Lansoprazole) .... Take 1 Tablet By Mouth Once A Day (As Needed) 4)  Vytorin 10-40 Mg Tabs (Ezetimibe-Simvastatin) .... Take 1 Tablet By Mouth Once A Day 5)  Albuterol Sulfate (2.5 Mg/36ml) 0.083% Nebu (Albuterol Sulfate) .... Every 6 Hours As Needed 6)  Aspirin 81 Mg Tbec (Aspirin) .... Take 1 Tablet By Mouth Once A Day  Allergies (verified): 1)  ! Codeine  Past History:  Past Medical History: Last updated: 11/12/2009 1. CAD status post stenting in 2003.   2. Hypertension.   3. COPD.   4. History of cigarette smoking.   5. 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[employer]  Risk Factors: Smoking Status: quit (02/25/2010) Packs/Day: 0.75 (02/25/2010)  Review of Systems      See HPI  Vital Signs:  Patient profile:   71 year old male Height:      65.5 inches Weight:      131.25 pounds BMI:     21.59 O2 Sat:      94 % on Room air Temp:     97.9 degrees F oral Pulse rate:   71 / minute BP sitting:   122 / 68  (left arm) Cuff size:    regular  Vitals Entered By: Boone Master CNA/MA (April 25, 2010 2:55 PM)  O2 Flow:  Room air CC: increased SOB, wheezing, prod cough with green/yellow mucus, sinus pressure/congestion w/ PND, HA x2weeks - denies f/c/s Is Patient Diabetic? No Comments Medications reviewed with patient Daytime contact number verified with patient. Boone Master CNA/MA  April 25, 2010 2:56 PM    Physical Exam  Additional Exam:  Gen. Pleasant,thin, in no distress, normal affect ENT - no lesions, no post nasal drip Neck: No JVD, no thyromegaly, no carotid bruits Lungs: coarse BS w/ no wheezing.  Cardiovascular: Rhythm regular, heart sounds  normal, no murmurs or gallops, no peripheral edema Musculoskeletal: No deformities, no cyanosis, clubbing ++      Impression & Recommendations:  Problem # 1:  C O P D (ICD-496)  RUL scarring unchanged on xray today , no acute findings.  Mild flare of COPD w/ associated bronchitis.  PLan:  Doxcycline 100mg  two times a day for 7 days  Mucinex DM two times a day as needed cough/congestion  May get flu shot in 1 week.  Please contact office for sooner follow up if symptoms do not improve or worsen  follow up Dr. Vassie Loll in 2 months and as needed   Orders: Est. Patient Level IV (04540)  Medications Added to Medication List This Visit: 1)  Doxycycline Hyclate 100 Mg Caps (Doxycycline hyclate) .Marland Kitchen.. 1 by mouth two times a day  Complete Medication List: 1)  Atrovent Hfa 17 Mcg/act Aers (Ipratropium bromide hfa) .... Three puffs two times a day 2)  Flovent Hfa 44 Mcg/act Aero (Fluticasone propionate  hfa) .... Inhale 2 puffs two times a day 3)  Prevacid 15 Mg Cpdr (Lansoprazole) .... Take 1 tablet by mouth once a day (as needed) 4)  Vytorin 10-40 Mg Tabs (Ezetimibe-simvastatin) .... Take 1 tablet by mouth once a day 5)  Albuterol Sulfate (2.5 Mg/75ml) 0.083% Nebu (Albuterol sulfate) .... Every 6 hours as needed 6)  Aspirin 81 Mg Tbec (Aspirin) .... Take 1  tablet by mouth once a day 7)  Doxycycline Hyclate 100 Mg Caps (Doxycycline hyclate) .Marland Kitchen.. 1 by mouth two times a day  Other Orders: T-2 View CXR (71020TC)   Patient Instructions: 1)  Doxcycline 100mg  two times a day for 7 days  2)  Mucinex DM two times a day as needed cough/congestion  3)  May get flu shot in 1 week.  4)  Please contact office for sooner follow up if symptoms do not improve or worsen  5)  follow up Dr. Vassie Loll in 2 months and as needed  Prescriptions: DOXYCYCLINE HYCLATE 100 MG CAPS (DOXYCYCLINE HYCLATE) 1 by mouth two times a day  #14 x 0   Entered and Authorized by:   Rubye Oaks NP   Signed by:   Tammy Parrett NP on 04/25/2010   Method used:  Print then Give to Patient   RxID:   [id number]

## 2010-09-12 NOTE — Progress Notes (Signed)
Summary: sob  Phone Note Call from Patient   Caller: Daughter Efraim Kaufmann Call For: alva Summary of Call: pt have appt on friday having sob using albuterol Initial call taken by: Rickard Patience,  November 12, 2009 9:54 AM  Follow-up for Phone Call        [facility] to offer appt today with RA. Carron Curie CMA  November 12, 2009 10:00 AM     Additional Follow-up for Phone Call Additional follow up Details #2::    Scheduled appt with RA for 2:45p today. Follow-up by: Darletta Moll,  November 12, 2009 11:20 AM

## 2010-09-12 NOTE — Letter (Signed)
Summary: Request for medical records/[employer]  Request for medical records/[employer]   Imported By: Sherian Rein 12/04/2009 10:37:42  _____________________________________________________________________  External Attachment:    Type:   Image     Comment:   External Document

## 2010-09-12 NOTE — Assessment & Plan Note (Signed)
Summary: rov 1 month ///kp   Visit Type:  Follow-up Primary Provider/Referring Provider:  Manus Gunning  CC:  Pt here for follow up. Pt states breathing is worse. Pt c/o increased SOB with activity .  History of Present Illness: 70 year old Caucasian male with history of coronary artery  disease who was  -readmitted to the hospital on March 22, 2011for persistent fever and cough.  Initially  discharged from the hospital a week prior to this admission with MRSA pneumonia.  Adm 3/22 -3/30/ 11 for Right upper lobe pneumonia (methicillin-resistant Staphylococcus  aureus).  MRSA 3/24 in sputum was S to clinda/ bactrim/ gent/ rifampin  Readm 4/4 -4/16 for rt pneumothorax & persistent air leak  February 25, 2010 3:02 PM  -Returns for 1 month follow . He feeling much better, he is getting stronger, close to his basline.  Off O2 . Cough and congestion are almost totally gone.  CXR 6/11 shows a gradual decrease in size of RUL opacity but not totally resolved. His appetitie has improved and weight is slowly going up. He  prefers his regimen of atrovent/ flovent & albuterol nebs to spiriva. Remains off cigs. Desaturated to 92% on walking 3 laps today CXR -scarring RUL, continues to shrink  Denies chest pain,  orthopnea, hemoptysis, fever, n/v/d, edema, headache.   Preventive Screening-Counseling & Management  Alcohol-Tobacco     Smoking Status: quit     Packs/Day: 0.75     Year Started: 1961     Year Quit: 2011     Pack years: 76  Current Medications (verified): 1)  Atrovent Hfa 17 Mcg/act Aers (Ipratropium Bromide Hfa) .... Three Puffs Two Times A Day 2)  Flovent Hfa 44 Mcg/act Aero (Fluticasone Propionate  Hfa) .... Inhale 2 Puffs Two Times A Day 3)  Prevacid 15 Mg Cpdr (Lansoprazole) .... Take 1 Tablet By Mouth Once A Day (As Needed) 4)  Vytorin 10-40 Mg Tabs (Ezetimibe-Simvastatin) .... Take 1 Tablet By Mouth Once A Day 5)  Albuterol Sulfate (2.5 Mg/19ml) 0.083% Nebu (Albuterol Sulfate) .... Every  6 Hours As Needed 6)  Aspirin 81 Mg Tbec (Aspirin) .... Take 1 Tablet By Mouth Once A Day  Allergies (verified): 1)  ! Codeine  Past History:  Past Medical History: Last updated: 11/12/2009 1. CAD status post stenting in 2003.   2. Hypertension.   3. COPD.   4. History of cigarette smoking.   5. 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  The patient denies anorexia, fever, weight loss, weight gain, vision loss, decreased hearing, hoarseness, chest pain, syncope, peripheral edema, prolonged cough, headaches, hemoptysis, abdominal pain, melena, hematochezia, severe indigestion/heartburn, hematuria, muscle weakness, suspicious skin lesions, transient blindness, difficulty walking, depression, unusual weight change, and abnormal bleeding.    Vital Signs:  Patient profile:   70 year old male Height:      65.5 inches Weight:      132 pounds BMI:     21.71 O2 Sat:      95 % on Room air Temp:     98.1 degrees F oral Pulse rate:   93 / minute BP sitting:   110 / 60  (left arm) Cuff size:  regular  Vitals Entered By: Zackery Barefoot CMA (February 25, 2010 2:54 PM)  O2 Flow:  Room air  Serial Vital Signs/Assessments:  Comments: 3:37 PM Ambulatory Pulse Oximetry  Resting; HR___99__    02 Sat__93% on room air___  Lap1 (185 feet)   HR___102__   02 Sat__94% on room air___ Lap2 (185 feet)   HR__105___   02 Sat__93% on room air___    Lap3 (185 feet)   HR_104____   02 Sat__92% on room air___  _x__Test Completed without Difficulty ___Test Stopped due to:    By: Michel Bickers CMA   CC: Pt here for follow up. Pt states breathing is worse.  Pt c/o increased SOB with activity  Comments Medications reviewed with patient Verified contact number and pharmacy with patient Zackery Barefoot CMA  February 25, 2010 2:54 PM    Physical Exam  Additional Exam:  Gen. Pleasant,thin, in no distress, normal affect ENT - no lesions, no post nasal drip Neck: No JVD, no thyromegaly, no carotid bruits Lungs: no use of accessory muscles, no dullness to percussion, decreased without rales or rhonchi  Cardiovascular: Rhythm regular, heart sounds  normal, no murmurs or gallops, no peripheral edema Musculoskeletal: No deformities, no cyanosis, clubbing ++      CXR  Procedure date:  02/25/2010  Findings:      Comparison: 01/28/2010   Findings: The cardiac silhouette, mediastinal and hilar contours are stable.  There are severe chronic lung changes/COPD with persistent right apical density.  This is likely scarring change. No new/acute pulmonary findings.  The bony thorax is intact.   IMPRESSION: Chronic lung changes/COPD without definite acute pulmonary findings.  Impression & Recommendations:  Problem # 1:  MRSA PNEUMONIA (ICD-482.40) -gradually resolving CXR today Orders: Est. Patient Level III (99213) T-2 View CXR (71020TC)  Problem # 2:  OXYGEN-USE OF SUPPLEMENTAL (ICD-V46.2) chk ONO , if no significant desaturation, can dc O2 Orders: DME Referral (DME)  Problem # 3:  C O P D (ICD-496) He  prefers his regimen of atrovent/ flovent & albuterol nebs to spiriva.  spirometry in the future once pna completely resolved.  Patient Instructions: 1)  Copy sent to: Ehinger 2)  Please schedule a follow-up appointment in 2 months with TP with CXR 3)  A chest x-ray has been recommended.  Your imaging study may require preauthorization.  4)  Take claritin or ZYRTEC OTC for nasal stuffiness    Appended Document: rov 1 month ///kp ONO results received and placed in Dr. Reginia Naas "look at". jwr  Appended Document: Orders Update no  significant desaturation, dc O2   Clinical Lists Changes  Orders: Added new Referral order of DME Referral (DME) - Signed      Appended Document: rov 1 month ///kp Order faxed to [facility] to D/C o2.

## 2010-09-12 NOTE — Progress Notes (Signed)
Summary: sick  Phone Note Call from Patient Call back at Home Phone [phone]   Caller: Daughter-Jorge [name] Call For: Jorge [name] Reason for Call: Talk to Nurse Summary of Call: pt has 3 days worth of antibiotics left, still has severe nausea, some vomiting, no fever, extreme cough, drainage from allergies.  Don't want him to come off antibiotic, until cleared up, please call and let daughter know what to do. Initial call taken by: Jorge [name],  December 03, 2009 9:20 AM  Follow-up for Phone Call        pt only has 3 days of the antibiotic left and daughter would like to know if it would be ok for pt to d/c since he has been taking for 6 weeks, daughter states the nausea and vomiting are getting worse pt is vomiting at least twice in a day---pls advise Follow-up by: Philipp Deputy CMA,  December 03, 2009 10:55 AM  Additional Follow-up for Phone Call Additional follow up Details #1::        Ok to stop antibiotic & call back in 3 days Additional Follow-up by: Comer Locket. Jorge Loll MD,  December 03, 2009 12:51 PM    Additional Follow-up for Phone Call Additional follow up Details #2::    pt's daughter aware to d/c antibiotic and call back in 3 days with update on pt Follow-up by: Philipp Deputy CMA,  December 03, 2009 1:32 PM

## 2010-09-12 NOTE — Progress Notes (Signed)
Summary: cxr  Phone Note Call from Patient   Caller: Daughter Call For: alva Summary of Call: per daughter Efraim Kaufmann Crumm: pt stopped abx (per dr Reginia Naas instructions) on monday. daughter says that pt's nausea has gone away. also says that pt's lungs are "clear" per home health nurse. daughter wants to know if pt will still need to have cxr that's schedule on 5/5 or should this date be changed? daughter says that dr Vassie Loll wanted pt to be off abx a certain amount of time. call melissa at [phone] Initial call taken by: Tivis Ringer, CNA,  December 07, 2009 12:38 PM  Follow-up for Phone Call        Pls advise if pt will still need cxr.  Follow-up by: Vernie Murders,  December 07, 2009 1:13 PM  Additional Follow-up for Phone Call Additional follow up Details #1::        yes - to make sure no worsening off ABx Additional Follow-up by: Comer Locket. Vassie Loll MD,  December 07, 2009 4:34 PM    Additional Follow-up for Phone Call Additional follow up Details #2::    Melissa informed pt should still have cxr to make sure sxs are no worse off abx and she had verbal understanding of this.Michel Bickers [facility]  December 07, 2009 4:39 PM

## 2010-10-24 ENCOUNTER — Encounter: Payer: Self-pay | Admitting: Adult Health

## 2010-10-24 ENCOUNTER — Ambulatory Visit (INDEPENDENT_AMBULATORY_CARE_PROVIDER_SITE_OTHER): Payer: Medicare Other | Admitting: Adult Health

## 2010-10-24 DIAGNOSIS — J449 Chronic obstructive pulmonary disease, unspecified: Secondary | ICD-10-CM

## 2010-10-29 NOTE — Assessment & Plan Note (Signed)
Summary: NP follow up - COPD   Primary Provider/Referring Provider:  Manus Gunning  CC:  4 month follow up - states breathing is "about the same."  reports no change in breathing with the spiriva.  History of Present Illness: 68/M with CAD for FU of COPD  Adm 3/22 -3/30/ 11 for Right upper lobe pneumonia (methicillin-resistant Staphylococcus  aureus).  MRSA 3/24 in sputum was S to clinda/ bactrim/ gent/ rifampin Readm 4/4 -4/16 for rt pneumothorax & persistent air leak -good resolution on FU imaging  June 24, 2010  Quit smoking since feb '11. His wife & daughter continue to smoke chest tightness, not related to exertion, spont resolves in 4 mins, needs albuterol neb two times a day as needed , dr turner checked ekg >>Spiriva trial --like atrovent better  xr>Stable COPD and asymmetric right apical pleural - parenchymal scarring.  No acute findings.  October 24, 2010--4 month follow up - states breathing is "about the same."  reports no change in breathing with the spiriva. Wears out at times but tries to stay active. Mows grass x witih push mower and has small garden.  Denies chest pain, orthopnea, hemoptysis, fever, n/v/d, edema, headache.   Medications Prior to Update: 1)  Atrovent Hfa 17 Mcg/act Aers (Ipratropium Bromide Hfa) .... Three Puffs Two Times A Day 2)  Flovent Hfa 44 Mcg/act Aero (Fluticasone Propionate  Hfa) .... Inhale 2 Puffs Two Times A Day 3)  Prevacid 15 Mg Cpdr (Lansoprazole) .... Take 1 Tablet By Mouth Once A Day (As Needed) 4)  Vytorin 10-40 Mg Tabs (Ezetimibe-Simvastatin) .... Take 1 Tablet By Mouth Once A Day 5)  Albuterol Sulfate (2.5 Mg/79ml) 0.083% Nebu (Albuterol Sulfate) .... Every 6 Hours As Needed 6)  Aspirin 81 Mg Tbec (Aspirin) .... Take 1 Tablet By Mouth Once A Day  Current Medications (verified): 1)  Atrovent Hfa 17 Mcg/act Aers (Ipratropium Bromide Hfa) .... Three Puffs Two Times A Day 2)  Flovent Hfa 44 Mcg/act Aero (Fluticasone Propionate  Hfa)  .... Inhale 2 Puffs Two Times A Day 3)  Prevacid 15 Mg Cpdr (Lansoprazole) .... Take 1 Tablet By Mouth Once A Day (As Needed) 4)  Vytorin 10-40 Mg Tabs (Ezetimibe-Simvastatin) .... Take 1 Tablet By Mouth Once A Day 5)  Albuterol Sulfate (2.5 Mg/61ml) 0.083% Nebu (Albuterol Sulfate) .... Every 6 Hours As Needed 6)  Aspirin 81 Mg Tbec (Aspirin) .... Take 1 Tablet By Mouth Once A Day  Allergies (verified): 1)  ! 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 CAD status post stenting in 2003.   2. Hypertension.   3. COPD.  ---Spiriva trial --like atrovent better 06/2011    4. History of cigarette smoking.   5. Hyperlipidemia  Vital Signs:  Patient profile:   70 year old male Height:      65.5 inches Weight:      145 pounds BMI:     23.85 O2 Sat:      94 % on Room air Temp:     97.5 degrees F oral Pulse rate:   80 / minute BP sitting:   128 / 72  (right arm) Cuff  size:   regular  Vitals Entered By: Boone Master CNA/MA (October 24, 2010 2:23 PM)  O2 Flow:  Room air CC: 4 month follow up - states breathing is "about the same."  reports no change in breathing with the spiriva Is Patient Diabetic? No Comments Medications reviewed with patient Daytime contact number verified with patient. Boone Master CNA/MA  October 24, 2010 2:24 PM    Physical Exam  Additional Exam:  wt 137 June 25, 2010 >>145 October 24, 2010  Gen. Pleasant,thin, in no distress, normal affect ENT - no lesions, no post nasal drip Neck: No JVD, no thyromegaly, no carotid bruits Lungs:  coarse BS w/ no wheezing.  Cardiovascular: Rhythm regular, heart sounds  normal, no murmurs or gallops, no peripheral edema Musculoskeletal: No deformities, no cyanosis, clubbing ++      Impression & Recommendations:  Problem # 1:  C O P D (ICD-496) Compensated on present regimen.  No significant improvement with spiriva per pt, will cont on atrovent and flovent  discussed pulm rehab and activity level.   Other Orders: Est. Patient Level III ([address])  Patient Instructions: 1)  Continue on same meds  2)  follow up Dr. Vassie Loll in 4 months and as needed

## 2010-10-30 LAB — COMPREHENSIVE METABOLIC PANEL
ALT: 33 U/L (ref 0–53)
AST: 25 U/L (ref 0–37)
BUN: 12 mg/dL (ref 6–23)
Potassium: 3.9 mEq/L (ref 3.5–5.1)
Sodium: 135 mEq/L (ref 135–145)
Total Bilirubin: 0.8 mg/dL (ref 0.3–1.2)
Total Protein: 5.9 g/dL — ABNORMAL LOW (ref 6.0–8.3)

## 2010-10-30 LAB — CBC
HCT: 27.4 % — ABNORMAL LOW (ref 39.0–52.0)
HCT: 29 % — ABNORMAL LOW (ref 39.0–52.0)
HCT: 36.7 % — ABNORMAL LOW (ref 39.0–52.0)
Hemoglobin: 12.1 g/dL — ABNORMAL LOW (ref 13.0–17.0)
Hemoglobin: 9.3 g/dL — ABNORMAL LOW (ref 13.0–17.0)
MCHC: 32.8 g/dL (ref 30.0–36.0)
MCHC: 34 g/dL (ref 30.0–36.0)
MCV: 92.9 fL (ref 78.0–100.0)
MCV: 93.4 fL (ref 78.0–100.0)
Platelets: 145 10*3/uL — ABNORMAL LOW (ref 150–400)
Platelets: 175 10*3/uL (ref 150–400)
Platelets: 526 10*3/uL — ABNORMAL HIGH (ref 150–400)
RBC: 2.95 MIL/uL — ABNORMAL LOW (ref 4.22–5.81)
RBC: 3.1 MIL/uL — ABNORMAL LOW (ref 4.22–5.81)
RBC: 3.94 MIL/uL — ABNORMAL LOW (ref 4.22–5.81)
RDW: 13.5 % (ref 11.5–15.5)
RDW: 13.6 % (ref 11.5–15.5)
WBC: 10.6 10*3/uL — ABNORMAL HIGH (ref 4.0–10.5)
WBC: 5.5 10*3/uL (ref 4.0–10.5)

## 2010-10-30 LAB — BASIC METABOLIC PANEL
CO2: 31 mEq/L (ref 19–32)
Calcium: 8.2 mg/dL — ABNORMAL LOW (ref 8.4–10.5)
Chloride: 97 mEq/L (ref 96–112)
Creatinine, Ser: 0.76 mg/dL (ref 0.4–1.5)
Creatinine, Ser: 0.77 mg/dL (ref 0.4–1.5)
Glucose, Bld: 104 mg/dL — ABNORMAL HIGH (ref 70–99)
Glucose, Bld: 130 mg/dL — ABNORMAL HIGH (ref 70–99)
Sodium: 134 mEq/L — ABNORMAL LOW (ref 135–145)

## 2010-10-30 LAB — PROTIME-INR: Prothrombin Time: 14.1 seconds (ref 11.6–15.2)

## 2010-11-03 LAB — CBC
HCT: 31.8 % — ABNORMAL LOW (ref 39.0–52.0)
HCT: 33.5 % — ABNORMAL LOW (ref 39.0–52.0)
HCT: 37.4 % — ABNORMAL LOW (ref 39.0–52.0)
HCT: 37.5 % — ABNORMAL LOW (ref 39.0–52.0)
HCT: 38.2 % — ABNORMAL LOW (ref 39.0–52.0)
HCT: 41.6 % (ref 39.0–52.0)
HCT: 46.5 % (ref 39.0–52.0)
Hemoglobin: 10 g/dL — ABNORMAL LOW (ref 13.0–17.0)
Hemoglobin: 11.3 g/dL — ABNORMAL LOW (ref 13.0–17.0)
Hemoglobin: 12.2 g/dL — ABNORMAL LOW (ref 13.0–17.0)
Hemoglobin: 13.2 g/dL (ref 13.0–17.0)
Hemoglobin: 13.3 g/dL (ref 13.0–17.0)
Hemoglobin: 15 g/dL (ref 13.0–17.0)
Hemoglobin: 9.8 g/dL — ABNORMAL LOW (ref 13.0–17.0)
MCHC: 31.9 g/dL (ref 30.0–36.0)
MCHC: 32.1 g/dL (ref 30.0–36.0)
MCHC: 32.2 g/dL (ref 30.0–36.0)
MCHC: 32.2 g/dL (ref 30.0–36.0)
MCHC: 32.3 g/dL (ref 30.0–36.0)
MCHC: 32.5 g/dL (ref 30.0–36.0)
MCHC: 32.5 g/dL (ref 30.0–36.0)
MCHC: 33.2 g/dL (ref 30.0–36.0)
MCHC: 33.5 g/dL (ref 30.0–36.0)
MCHC: 34.1 g/dL (ref 30.0–36.0)
MCHC: 34.2 g/dL (ref 30.0–36.0)
MCV: 94.8 fL (ref 78.0–100.0)
MCV: 94.9 fL (ref 78.0–100.0)
MCV: 95.3 fL (ref 78.0–100.0)
MCV: 97 fL (ref 78.0–100.0)
MCV: 97.5 fL (ref 78.0–100.0)
MCV: 97.7 fL (ref 78.0–100.0)
MCV: 97.9 fL (ref 78.0–100.0)
MCV: 98 fL (ref 78.0–100.0)
MCV: 98.1 fL (ref 78.0–100.0)
MCV: 98.3 fL (ref 78.0–100.0)
Platelets: 235 10*3/uL (ref 150–400)
Platelets: 255 10*3/uL (ref 150–400)
Platelets: 296 10*3/uL (ref 150–400)
Platelets: 296 10*3/uL (ref 150–400)
Platelets: 297 10*3/uL (ref 150–400)
Platelets: 339 10*3/uL (ref 150–400)
Platelets: 352 10*3/uL (ref 150–400)
RBC: 3.3 MIL/uL — ABNORMAL LOW (ref 4.22–5.81)
RBC: 3.34 MIL/uL — ABNORMAL LOW (ref 4.22–5.81)
RBC: 3.54 MIL/uL — ABNORMAL LOW (ref 4.22–5.81)
RBC: 3.81 MIL/uL — ABNORMAL LOW (ref 4.22–5.81)
RBC: 3.9 MIL/uL — ABNORMAL LOW (ref 4.22–5.81)
RBC: 4.18 MIL/uL — ABNORMAL LOW (ref 4.22–5.81)
RBC: 4.23 MIL/uL (ref 4.22–5.81)
RBC: 4.74 MIL/uL (ref 4.22–5.81)
RDW: 12.6 % (ref 11.5–15.5)
RDW: 12.7 % (ref 11.5–15.5)
RDW: 13.6 % (ref 11.5–15.5)
RDW: 13.7 % (ref 11.5–15.5)
RDW: 14 % (ref 11.5–15.5)
WBC: 14.5 10*3/uL — ABNORMAL HIGH (ref 4.0–10.5)
WBC: 16.1 10*3/uL — ABNORMAL HIGH (ref 4.0–10.5)
WBC: 5 10*3/uL (ref 4.0–10.5)
WBC: 5.4 10*3/uL (ref 4.0–10.5)
WBC: 8.6 10*3/uL (ref 4.0–10.5)

## 2010-11-03 LAB — COMPREHENSIVE METABOLIC PANEL
ALT: 26 U/L (ref 0–53)
ALT: 26 U/L (ref 0–53)
ALT: 26 U/L (ref 0–53)
ALT: 27 U/L (ref 0–53)
ALT: 29 U/L (ref 0–53)
AST: 27 U/L (ref 0–37)
AST: 27 U/L (ref 0–37)
AST: 27 U/L (ref 0–37)
AST: 28 U/L (ref 0–37)
Albumin: 1.3 g/dL — ABNORMAL LOW (ref 3.5–5.2)
Albumin: 1.3 g/dL — ABNORMAL LOW (ref 3.5–5.2)
Albumin: 1.4 g/dL — ABNORMAL LOW (ref 3.5–5.2)
Albumin: 1.5 g/dL — ABNORMAL LOW (ref 3.5–5.2)
Albumin: 1.7 g/dL — ABNORMAL LOW (ref 3.5–5.2)
Alkaline Phosphatase: 47 U/L (ref 39–117)
Alkaline Phosphatase: 56 U/L (ref 39–117)
BUN: 8 mg/dL (ref 6–23)
CO2: 25 mEq/L (ref 19–32)
CO2: 31 mEq/L (ref 19–32)
CO2: 32 mEq/L (ref 19–32)
Calcium: 7.3 mg/dL — ABNORMAL LOW (ref 8.4–10.5)
Calcium: 7.4 mg/dL — ABNORMAL LOW (ref 8.4–10.5)
Calcium: 7.5 mg/dL — ABNORMAL LOW (ref 8.4–10.5)
Calcium: 7.7 mg/dL — ABNORMAL LOW (ref 8.4–10.5)
Calcium: 8 mg/dL — ABNORMAL LOW (ref 8.4–10.5)
Calcium: 8 mg/dL — ABNORMAL LOW (ref 8.4–10.5)
Chloride: 97 mEq/L (ref 96–112)
Chloride: 98 mEq/L (ref 96–112)
Creatinine, Ser: 0.66 mg/dL (ref 0.4–1.5)
Creatinine, Ser: 0.67 mg/dL (ref 0.4–1.5)
Creatinine, Ser: 1.1 mg/dL (ref 0.4–1.5)
GFR calc Af Amer: >60 mL/min (ref >60)
GFR calc Af Amer: >60 mL/min (ref >60)
GFR calc Af Amer: >60 mL/min (ref >60)
GFR calc Af Amer: >60 mL/min (ref >60)
GFR calc Af Amer: >60 mL/min (ref >60)
GFR calc non Af Amer: >60 mL/min (ref >60)
GFR calc non Af Amer: >60 mL/min (ref >60)
GFR calc non Af Amer: >60 mL/min (ref >60)
Glucose, Bld: 120 mg/dL — ABNORMAL HIGH (ref 70–99)
Glucose, Bld: 148 mg/dL — ABNORMAL HIGH (ref 70–99)
Glucose, Bld: 170 mg/dL — ABNORMAL HIGH (ref 70–99)
Glucose, Bld: 89 mg/dL (ref 70–99)
Potassium: 3.7 mEq/L (ref 3.5–5.1)
Potassium: 3.8 mEq/L (ref 3.5–5.1)
Sodium: 133 mEq/L — ABNORMAL LOW (ref 135–145)
Sodium: 134 mEq/L — ABNORMAL LOW (ref 135–145)
Sodium: 134 mEq/L — ABNORMAL LOW (ref 135–145)
Sodium: 139 mEq/L (ref 135–145)
Total Bilirubin: 0.6 mg/dL (ref 0.3–1.2)
Total Protein: 5.9 g/dL — ABNORMAL LOW (ref 6.0–8.3)
Total Protein: 6 g/dL (ref 6.0–8.3)
Total Protein: 6 g/dL (ref 6.0–8.3)
Total Protein: 6.5 g/dL (ref 6.0–8.3)
Total Protein: 7 g/dL (ref 6.0–8.3)

## 2010-11-03 LAB — CULTURE, RESPIRATORY W GRAM STAIN

## 2010-11-03 LAB — EXPECTORATED SPUTUM ASSESSMENT W GRAM STAIN, RFLX TO RESP C

## 2010-11-03 LAB — CULTURE, BLOOD (ROUTINE X 2)
Culture: NO GROWTH
Culture: NO GROWTH
Culture: NO GROWTH

## 2010-11-03 LAB — GLUCOSE, CAPILLARY
Glucose-Capillary: 119 mg/dL — ABNORMAL HIGH (ref 70–99)
Glucose-Capillary: 127 mg/dL — ABNORMAL HIGH (ref 70–99)
Glucose-Capillary: 129 mg/dL — ABNORMAL HIGH (ref 70–99)
Glucose-Capillary: 135 mg/dL — ABNORMAL HIGH (ref 70–99)
Glucose-Capillary: 154 mg/dL — ABNORMAL HIGH (ref 70–99)
Glucose-Capillary: 158 mg/dL — ABNORMAL HIGH (ref 70–99)
Glucose-Capillary: 159 mg/dL — ABNORMAL HIGH (ref 70–99)
Glucose-Capillary: 160 mg/dL — ABNORMAL HIGH (ref 70–99)
Glucose-Capillary: 167 mg/dL — ABNORMAL HIGH (ref 70–99)
Glucose-Capillary: 170 mg/dL — ABNORMAL HIGH (ref 70–99)
Glucose-Capillary: 182 mg/dL — ABNORMAL HIGH (ref 70–99)
Glucose-Capillary: 184 mg/dL — ABNORMAL HIGH (ref 70–99)
Glucose-Capillary: 193 mg/dL — ABNORMAL HIGH (ref 70–99)
Glucose-Capillary: 195 mg/dL — ABNORMAL HIGH (ref 70–99)
Glucose-Capillary: 209 mg/dL — ABNORMAL HIGH (ref 70–99)
Glucose-Capillary: 209 mg/dL — ABNORMAL HIGH (ref 70–99)
Glucose-Capillary: 308 mg/dL — ABNORMAL HIGH (ref 70–99)
Glucose-Capillary: 83 mg/dL (ref 70–99)
Glucose-Capillary: 88 mg/dL (ref 70–99)
Glucose-Capillary: 90 mg/dL (ref 70–99)

## 2010-11-03 LAB — FOLATE: Folate: 4.4 ng/mL

## 2010-11-03 LAB — DIFFERENTIAL
Basophils Absolute: 0 10*3/uL (ref 0.0–0.1)
Basophils Relative: 0 % (ref 0–1)
Basophils Relative: 0 % (ref 0–1)
Eosinophils Absolute: 0 10*3/uL (ref 0.0–0.7)
Eosinophils Absolute: 0 10*3/uL (ref 0.0–0.7)
Eosinophils Absolute: 0 10*3/uL (ref 0.0–0.7)
Eosinophils Absolute: 0.1 10*3/uL (ref 0.0–0.7)
Eosinophils Absolute: 0.1 10*3/uL (ref 0.0–0.7)
Eosinophils Relative: 0 % (ref 0–5)
Eosinophils Relative: 0 % (ref 0–5)
Eosinophils Relative: 0 % (ref 0–5)
Eosinophils Relative: 1 % (ref 0–5)
Lymphocytes Relative: 11 % — ABNORMAL LOW (ref 12–46)
Lymphocytes Relative: 14 % (ref 12–46)
Lymphocytes Relative: 6 % — ABNORMAL LOW (ref 12–46)
Lymphs Abs: 0.5 10*3/uL — ABNORMAL LOW (ref 0.7–4.0)
Lymphs Abs: 0.7 10*3/uL (ref 0.7–4.0)
Lymphs Abs: 0.7 10*3/uL (ref 0.7–4.0)
Lymphs Abs: 0.8 10*3/uL (ref 0.7–4.0)
Lymphs Abs: 0.9 10*3/uL (ref 0.7–4.0)
Monocytes Absolute: 0.1 10*3/uL (ref 0.1–1.0)
Monocytes Absolute: 0.6 10*3/uL (ref 0.1–1.0)
Monocytes Absolute: 0.8 10*3/uL (ref 0.1–1.0)
Monocytes Relative: 12 % (ref 3–12)
Monocytes Relative: 14 % — ABNORMAL HIGH (ref 3–12)
Monocytes Relative: 15 % — ABNORMAL HIGH (ref 3–12)
Monocytes Relative: 2 % — ABNORMAL LOW (ref 3–12)
Monocytes Relative: 2 % — ABNORMAL LOW (ref 3–12)
Monocytes Relative: 5 % (ref 3–12)
Neutro Abs: 11.8 10*3/uL — ABNORMAL HIGH (ref 1.7–7.7)
Neutro Abs: 15 10*3/uL — ABNORMAL HIGH (ref 1.7–7.7)
Neutrophils Relative %: 69 % (ref 43–77)
Neutrophils Relative %: 72 % (ref 43–77)
Neutrophils Relative %: 92 % — ABNORMAL HIGH (ref 43–77)

## 2010-11-03 LAB — LIPID PANEL
Cholesterol: 33 mg/dL (ref 0–200)
HDL: <10 mg/dL — ABNORMAL LOW (ref >39)
Triglycerides: 45 mg/dL (ref <150)

## 2010-11-03 LAB — URINALYSIS, ROUTINE W REFLEX MICROSCOPIC
Protein, ur: NEGATIVE mg/dL
Specific Gravity, Urine: 1.017 (ref 1.005–1.030)
Urobilinogen, UA: 0.2 mg/dL (ref 0.0–1.0)

## 2010-11-03 LAB — AFB CULTURE WITH SMEAR (NOT AT ARMC): Acid Fast Smear: NONE SEEN

## 2010-11-03 LAB — BASIC METABOLIC PANEL
BUN: 14 mg/dL (ref 6–23)
BUN: 16 mg/dL (ref 6–23)
BUN: 33 mg/dL — ABNORMAL HIGH (ref 6–23)
CO2: 26 mEq/L (ref 19–32)
CO2: 34 mEq/L — ABNORMAL HIGH (ref 19–32)
Calcium: 8.2 mg/dL — ABNORMAL LOW (ref 8.4–10.5)
Calcium: 8.5 mg/dL (ref 8.4–10.5)
Chloride: 110 mEq/L (ref 96–112)
Chloride: 110 mEq/L (ref 96–112)
Creatinine, Ser: 0.75 mg/dL (ref 0.4–1.5)
Creatinine, Ser: 0.76 mg/dL (ref 0.4–1.5)
Creatinine, Ser: 1.15 mg/dL (ref 0.4–1.5)
GFR calc Af Amer: >60 mL/min (ref >60)
GFR calc Af Amer: >60 mL/min (ref >60)
GFR calc non Af Amer: >60 mL/min (ref >60)
GFR calc non Af Amer: >60 mL/min (ref >60)
Glucose, Bld: 122 mg/dL — ABNORMAL HIGH (ref 70–99)
Glucose, Bld: 128 mg/dL — ABNORMAL HIGH (ref 70–99)
Glucose, Bld: 141 mg/dL — ABNORMAL HIGH (ref 70–99)
Potassium: 4.7 mEq/L (ref 3.5–5.1)
Potassium: 5 mEq/L (ref 3.5–5.1)
Sodium: 138 mEq/L (ref 135–145)

## 2010-11-03 LAB — MAGNESIUM
Magnesium: 1.8 mg/dL (ref 1.5–2.5)
Magnesium: 1.9 mg/dL (ref 1.5–2.5)
Magnesium: 2 mg/dL (ref 1.5–2.5)
Magnesium: 2.1 mg/dL (ref 1.5–2.5)

## 2010-11-03 LAB — URINE CULTURE

## 2010-11-03 LAB — HEMOGLOBIN A1C
Hgb A1c MFr Bld: 6.1 % (ref 4.6–6.1)
Mean Plasma Glucose: 128 mg/dL

## 2010-11-03 LAB — RETICULOCYTES
RBC.: 3.77 MIL/uL — ABNORMAL LOW (ref 4.22–5.81)
Retic Count, Absolute: 27.7 10*3/uL (ref 19.0–186.0)
Retic Count, Absolute: 30.2 10*3/uL (ref 19.0–186.0)
Retic Ct Pct: 0.7 % (ref 0.4–3.1)

## 2010-11-03 LAB — BLOOD GAS, ARTERIAL
Acid-Base Excess: 5.4 mmol/L — ABNORMAL HIGH (ref 0.0–2.0)
Acid-base deficit: 2 mmol/L (ref 0.0–2.0)
Bicarbonate: 23.4 mEq/L (ref 20.0–24.0)
Bicarbonate: 29.9 mEq/L — ABNORMAL HIGH (ref 20.0–24.0)
Drawn by: 103701
Drawn by: 32402
O2 Content: 2 L/min
O2 Content: 3 L/min
O2 Saturation: 92 %
O2 Saturation: 97.1 %
pO2, Arterial: 65.7 mmHg — ABNORMAL LOW (ref 80.0–100.0)
pO2, Arterial: 85.1 mmHg (ref 80.0–100.0)

## 2010-11-03 LAB — VANCOMYCIN, TROUGH: Vancomycin Tr: 10.1 ug/mL (ref 10.0–20.0)

## 2010-11-03 LAB — CARDIAC PANEL(CRET KIN+CKTOT+MB+TROPI)
Relative Index: INVALID (ref 0.0–2.5)
Relative Index: INVALID (ref 0.0–2.5)
Troponin I: 0.01 ng/mL (ref 0.00–0.06)
Troponin I: 0.02 ng/mL (ref 0.00–0.06)
Troponin I: <0.01 ng/mL (ref 0.00–0.06)

## 2010-11-03 LAB — IRON AND TIBC
Iron: <10 ug/dL — ABNORMAL LOW (ref 42–135)
UIBC: 116 ug/dL

## 2010-11-03 LAB — MRSA PCR SCREENING: MRSA by PCR: NEGATIVE

## 2010-11-03 LAB — PHOSPHORUS: Phosphorus: 2.6 mg/dL (ref 2.3–4.6)

## 2010-11-03 LAB — VITAMIN B12
Vitamin B-12: 465 pg/mL (ref 211–911)
Vitamin B-12: 466 pg/mL (ref 211–911)

## 2010-11-03 LAB — TSH
TSH: 0.55 u[IU]/mL (ref 0.350–4.500)
TSH: 0.86 u[IU]/mL (ref 0.350–4.500)

## 2010-11-03 LAB — D-DIMER, QUANTITATIVE: D-Dimer, Quant: 3.69 ug/mL-FEU — ABNORMAL HIGH (ref 0.00–0.48)

## 2010-11-03 LAB — CORTISOL: Cortisol, Plasma: 40.9 ug/dL

## 2010-11-03 LAB — SEDIMENTATION RATE: Sed Rate: 110 mm/hr — ABNORMAL HIGH (ref 0–16)

## 2010-11-21 LAB — COMPREHENSIVE METABOLIC PANEL
ALT: 20 U/L (ref 0–53)
AST: 22 U/L (ref 0–37)
AST: 26 U/L (ref 0–37)
Albumin: 2.8 g/dL — ABNORMAL LOW (ref 3.5–5.2)
Albumin: 3.8 g/dL (ref 3.5–5.2)
Alkaline Phosphatase: 50 U/L (ref 39–117)
BUN: 7 mg/dL (ref 6–23)
CO2: 30 mEq/L (ref 19–32)
Calcium: 9.2 mg/dL (ref 8.4–10.5)
Chloride: 104 mEq/L (ref 96–112)
Chloride: 105 mEq/L (ref 96–112)
GFR calc Af Amer: >60 mL/min (ref >60)
GFR calc Af Amer: >60 mL/min (ref >60)
GFR calc non Af Amer: >60 mL/min (ref >60)
Potassium: 3.5 mEq/L (ref 3.5–5.1)
Sodium: 140 mEq/L (ref 135–145)
Total Bilirubin: 1.4 mg/dL — ABNORMAL HIGH (ref 0.3–1.2)
Total Bilirubin: 1.8 mg/dL — ABNORMAL HIGH (ref 0.3–1.2)
Total Protein: 5 g/dL — ABNORMAL LOW (ref 6.0–8.3)

## 2010-11-21 LAB — DIFFERENTIAL
Eosinophils Absolute: 0.2 10*3/uL (ref 0.0–0.7)
Eosinophils Relative: 1 % (ref 0–5)
Lymphs Abs: 0.8 10*3/uL (ref 0.7–4.0)
Monocytes Absolute: 0.9 10*3/uL (ref 0.1–1.0)

## 2010-11-21 LAB — URINALYSIS, ROUTINE W REFLEX MICROSCOPIC
Bilirubin Urine: NEGATIVE
Bilirubin Urine: NEGATIVE
Nitrite: NEGATIVE
Nitrite: NEGATIVE
Protein, ur: NEGATIVE mg/dL
Specific Gravity, Urine: >1.046 — ABNORMAL HIGH (ref 1.005–1.030)
Specific Gravity, Urine: >1.046 — ABNORMAL HIGH (ref 1.005–1.030)
Urobilinogen, UA: 0.2 mg/dL (ref 0.0–1.0)
pH: 6.5 (ref 5.0–8.0)

## 2010-11-21 LAB — URINE CULTURE
Colony Count: 10000
Special Requests: NEGATIVE

## 2010-11-21 LAB — CBC
HCT: 39.2 % (ref 39.0–52.0)
Platelets: 170 10*3/uL (ref 150–400)
Platelets: 206 10*3/uL (ref 150–400)
RBC: 5.06 MIL/uL (ref 4.22–5.81)
RDW: 13.3 % (ref 11.5–15.5)
WBC: 11.8 10*3/uL — ABNORMAL HIGH (ref 4.0–10.5)
WBC: 7.2 10*3/uL (ref 4.0–10.5)

## 2010-11-21 LAB — URINE MICROSCOPIC-ADD ON

## 2010-11-21 LAB — POCT CARDIAC MARKERS
CKMB, poc: 4.3 ng/mL (ref 1.0–8.0)
Myoglobin, poc: 96 ng/mL (ref 12–200)
Troponin i, poc: <0.05 ng/mL (ref 0.00–0.09)

## 2010-11-21 LAB — CULTURE, BLOOD (ROUTINE X 2): Culture: NO GROWTH

## 2010-11-21 LAB — LIPASE, BLOOD: Lipase: 16 U/L (ref 11–59)

## 2010-12-24 NOTE — Consult Note (Signed)
NAMESUREN, PAYNE NO.:  [id number]   MEDICAL RECORD NO.:  [id number]          PATIENT TYPE:  INP   LOCATION:  1525                         FACILITY:  [facility]   PHYSICIAN:  Velora Heckler, MD      DATE OF BIRTH:  October 11, 1940   DATE OF CONSULTATION:  10/16/2008  DATE OF DISCHARGE:                                 CONSULTATION   REFERRING PHYSICIAN:  Dr. Demetrius Charity from [employer].   CHIEF COMPLAINT:  Partial small-bowel obstruction.   HISTORY OF PRESENT ILLNESS:  Jorge [name] is a 70 year old white male  admitted on October 15, 2008, to the hospitalist service with signs and  symptoms of partial small-bowel obstruction.  The patient had developed  nausea and emesis.  He had colicky abdominal pain.  He presented to the  emergency department and was admitted onto the medical service.  The  patient was treated initially with bowel rest.  CT scan of the abdomen  and pelvis was obtained which showed a mid small bowel loop which was  dilated.  The patient was rehydrated intravenously.  Abdominal x-rays on  October 16, 2008, however, show the contrast has passed all the way to the  colon.  There is no obvious transition point.  The dilated loop of small  bowel has resolved.  The patient's diet was advanced and he has  tolerated two solid food meals.  General surgery is now asked to consult  regarding partial small-bowel obstruction and its management.   PAST MEDICAL HISTORY:  1. Status post cholecystectomy in 2007, by Dr. Harriette Bouillon.  2. History of COPD.  3. History of hypertension.  4. History of hypercholesterolemia.   MEDICATIONS:  Flovent, albuterol, atenolol, Vytorin and baby aspirin.   ALLERGIES:  NONE KNOWN.   SOCIAL HISTORY:  The patient has a 30 pack-year smoking history.  He  denies alcohol use.   FAMILY HISTORY:  Noncontributory.   A 15-point system review documented in the admission history and  physical.   PHYSICAL EXAMINATION:  GENERAL:  A  70 year old white male in no acute  distress on [facility] [address] [facility] at [facility].  VITAL SIGNS:  Temperature 99.0, pulse 84, respirations 16, blood  pressure 107/64.  O2 saturation 96% on room air.  HEENT:  Shows him to be normocephalic, atraumatic.  Sclerae clear.  Conjunctiva clear.  Pupils equal, and reactive.  Dentition fair.  Mucous  membranes moist.  Voice normal.  NECK:  Palpation of the neck shows no lymphadenopathy.  No thyroid  nodularity.  No masses.  No tenderness.  LUNGS:  Clear bilaterally with a few scattered wheezes.  CARDIAC:  Shows regular rate and rhythm without murmur.  Peripheral  pulses are full EXTREMITIES:  Nontender without edema.  NEUROLOGICAL:  The patient is alert and oriented without focal deficit.  There is no sign of tremor.  ABDOMEN:  Soft, nontender without  distention.  Surgical wounds are well-healed.  Bowel sounds are present.   LABORATORY STUDIES:  White count 7.2, hemoglobin 13.1, platelet count  170,000.  Electrolytes are normal.  Total bilirubin slightly  elevated at  1.8.  Urinalysis is benign.   Abdominal x-ray shows resolution of small bowel obstruction with oral  contrast from CT scan present within the colon.   IMPRESSION:  Resolved small-bowel obstruction.   PLAN:  The patient will be reevaluated by surgery on the morning of  October 17, 2008.  At this point, the patient is already on a regular  diet.  He is receiving intravenous hydration.  He is comfortable.  The  patient will likely be discharged home on October 17, 2008, if his  clinical course continues to improve.      Velora Heckler, MD  Electronically Signed     TMG/MEDQ  D:  10/16/2008  T:  10/17/2008  Job:  [id number]

## 2010-12-24 NOTE — Assessment & Plan Note (Signed)
[facility]                             PULMONARY OFFICE NOTE   NAME:[name], Jorge ZINGARO                      MRN:          [id number]  DATE:04/22/2007                            DOB:          03-14-1941    HISTORY:  A 70 year old, white male, active smoker seen by Dr. Vassie [name] on  August 26 with a history suggesting COPD and started therefore on  Spiriva daily. It is not clear the patient really understood his  instructions as he continued to take Atrovent as well but said he could  not really tell any difference when he added the Spiriva to Flovent and  denies any history of significant improvement of dyspnea although states  that he really does not do much that makes him short of breath and could  not name a single activity he would like to do either faster or longer  where his breathing really limits him. He does have somewhat of a  congested cough that is worse in the morning but states that also did  not improve on Spiriva.. He denies any pleuritic or exertional chest  pain, orthopnea, PND and no leg swelling.   He returns as requested for PFTs in no acute distress.   PHYSICAL EXAMINATION:  VITAL SIGNS:  He had stable vital signs.  HEENT:  Unremarkable. Pharynx clear.  LUNGS:  Lung fields reveal diminished breath sounds with hyperaeration  on percussion and Hoover sign positive on early inspiration.  HEART:  Regular rate and rhythm without murmur, gallop or rub. No  increase in P2.  ABDOMEN:  Soft and benign.  EXTREMITIES:  Warm without calf tenderness, cyanosis, or clubbing.   PFTs were reviewed with the patient today and revealed an FEV1 of only  38% with a ratio of 27%.   IMPRESSION:  1. This patient has severe chronic obstructive pulmonary disease and      is actively smoking. I told him that 38% was definitely flunking      on the test but if he dropped all the way down to 30% he would      definitely feel it in terms of ADLs. I suspect what  this patient      has been doing is gradually reducing the amount of activity he does      so can justify the continued use of cigarettes (a pattern of denial      is very common in men). It would be therefore very difficult to      overcome this attitude in terms of having the patient understand      first of all that stopping smoking is the most important thing he      can do and far exceeds the values of inhalers or doctors in terms      of the nature history of COPD.  2. If he does begin to have more exacerbations, I would probably      switch him over to Advair 250/50 b.i.d. based on the new data on      Advair. If he notices a decline in his best day  function then I      would stop the Atrovent and use Spiriva one daily although I      cautioned him that the sooner he stopped smoking the better his      best day function will be maintained.   I have arranged for him to see Dr. Vassie [name] in 3 months. We will certainly  see him sooner if needed.     Charlaine Dalton. Sherene Sires, MD, [facility]  Electronically Signed    MBW/MedQ  DD: 04/22/2007  DT: 04/23/2007  Job #: [id number]   cc:   Bryan Lemma. Manus Gunning, M.D.

## 2010-12-24 NOTE — H&P (Signed)
NAMETOLBERT, MATHESON NO.:  [id number]   MEDICAL RECORD NO.:  [id number]          PATIENT TYPE:  EMS   LOCATION:  ED                           FACILITY:  [facility]   PHYSICIAN:  Pedro Earls, MD     DATE OF BIRTH:  July 11, 1941   DATE OF ADMISSION:  10/15/2008  DATE OF DISCHARGE:                              HISTORY & PHYSICAL   CHIEF COMPLAINT:  Abdominal pain, nausea, and vomiting.   HPI:  This is a 70 year old white male patient with a past medical  history significant for hypertension, COPD who was currently  asymptomatic 4 days ago when he started having some nausea, as well as  vomiting and some abdominal pain which colicky in nature.  Patient's  symptoms improved for the next 2 days and came back again yesterday  around 6 p.m. when patient had similar abdominal pain which was  associated with nausea, as well as episodes of vomiting.  Patient had a  loss of appetite, denies any blood in his vomitus or in his stool.  There was no fever or chills, shortness of breath, or chest pain.  Patient had the shortness of breath at baseline but did not get worse.   Patient describes abdominal pain as more of a soreness which is constant  and it is nonradiating.  On a scale of 1 to 10 it is a 4/10 when 10 is  the worst pain.   REVIEW OF SYSTEMS:  As above, rest of the review of systems are  negative.   PAST MEDICAL HISTORY:  1. COPD.  2. Hypertension.  3. Hypercholesterolemia.   PAST SURGICAL HISTORY:  Cholecystectomy, laparoscopic.   SOCIAL HISTORY:  Patient smokes 9 to 10 cigarettes per day for past 50  years.  No alcohol.  No IV drug abuse.   FAMILY HISTORY:  Positive for hypertension.   MEDICATIONS:  1. Flovent 1 puff b.i.d.  2. Albuterol 2 puffs b.i.d. as needed.  3. Atenolol 50 daily.  4. Vytorin 10/80 one tab daily.  5. Aspirin 81 daily.   ALLERGIES:  NKDA.   PHYSICAL EXAM:  VITALS:  Temperature 98.3 to 99.1.  Blood pressure 100  to 120/50s to 70s.   Pulse 60 to 75.  Respirations 16.  Pulse ox 97% to  99% on room air.  Patient awake, alert, oriented x3.  Does not appear to  be in acute distress.  HEENT:  Pupils equal, round, and reactive to light.  No icterus.  No  pallor.  Extraocular movements intact.  Oral mucosa is dry.  NECK:  Supple.  No JVD.  No lymphadenopathy.  CVS:  S1 and S2.  Regular.  No murmurs, heaves, or gallops.  CHEST:  Patient has expiratory wheezing, bilateral, anterior and  posterior lung fields.  ABDOMEN:  Soft.  There is minimal tenderness which is generalized.  No  rebound.  Bowel sounds are present.  No hepatosplenomegaly.  EXTREMITIES:  Pedal pulses are present.  No clubbing, cyanosis, or  edema.  CNS:  Stable and grossly intact.  Cranial nerves II-XII are intact.  No  rashes.  MUSCULOSKELETAL:  Unremarkable.   LABS:  Patient's chemistry is fairly unremarkable except for a glucose  of 101 and a bilirubin of 1.4.  White count is 11.8 with a platelet  count of 206 and neutrophils of 84.  CT scan, abdomen and pelvis, did  not reveal any acute abnormality.  Troponins were less than 0.05.  CT  head was unremarkable.  Lipase was 16.   IMPRESSION:  1. Possible partial small bowel obstruction.  Possible gastritis.      Rule out gastroparesis.  2. Dehydration.  3. Abdominal pain.  4. Nausea and vomiting.  5. Hypertension.  6. Hyperlipidemia.  7. Chronic obstructive pulmonary disease.   PLAN:  Admit to inpatient, [facility].  Clear liquids, advance as  tolerated.  Gastric emptying scan in the morning.  Start Reglan  empirically 5 mg IV q.a.c. h.s.  Abdominal x-ray in a.m. to follow up on  a CT finding.  IV fluids.  Continue Protonix.  Start Zofran.      Pedro Earls, MD  Electronically Signed     NS/MEDQ  D:  10/15/2008  T:  10/15/2008  Job:  [id number]

## 2010-12-24 NOTE — Discharge Summary (Signed)
NAMEPRESTON, GARABEDIAN NO.:  [id number]   MEDICAL RECORD NO.:  [id number]          PATIENT TYPE:  INP   LOCATION:  1525                         [facility]ACILITY:  [facility]   PHYSICIAN:  Monte Fantasia, MD  DATE OF BIRTH:  1941-06-05   DATE OF ADMISSION:  10/15/2008  DATE OF DISCHARGE:  10/17/2008                               DISCHARGE SUMMARY   PRIMARY CARE PHYSICIAN:  Angeline Slim, M.D., [facility]   DISCHARGE DIAGNOSES:  1. Small bowel obstruction.  2. Dehydration.  3. Nausea and vomiting, possibly secondary to small bowel obstruction.  4. Hypertension.  5. Hyperlipidemia.  6. COPD.  7. Urinary tract infection.   DISCHARGE MEDICATIONS:  1. Ciprofloxacin 250 mg p.o. q. 12 for 5 days.  2. Flagyl 500 mg p.o. q. 8 hours for five days.  3. Guaifenesin 600 mg p.o. b.i.d.  4. Omeprazole 40 mg p.o. daily.  5. Flovent one puff inhalations b.i.d.  6. Albuterol 2 puffs inhalations q.6 h p.r.n.  7. Atenolol 50 mg p.o. daily.  8. Vytorin 10/80 one tablet p.o. daily.  9. Aspirin 81 mg p.o. daily.   HOSPITAL COURSE:  A 70 year old Caucasian male patient was admitted on  October 15, 2008, with complaints of abdominal pain, nausea and vomiting.  The patient had an abdominal x-ray done in the ER which showed small-  bowel obstruction versus small bowel ileus.  The patient was initially  kept n.p.o. CT scan of the abdomen with contrast was done which showed  dilated mid small bowel loops without discretion transition point, small  amount of perihepatic ascites and distal descending and sigmoid  diverticulosis.  The patient was gradually started on clear liquid diet.  Has tolerated clear liquid diet well.  The patient was also evaluated up  with surgery in view of his small bowel obstruction.  As per the  surgical evaluation, recommended conservative management and to advance  diet as tolerated.  The patient also had a gastric emptying study done  to rule out  gastroparesis which showed normal gastric emptying time.  Repeat abdominal x-ray done today on October 17, 2008, showed no evidence  of obstruction and the patient denies any abdominal pain.  The patient  advanced her diet and if tolerated, the patient can be discharged home  today.  Is medically stable to be discharged home today.   RADIOLOGICAL INVESTIGATIONS DONE DURING THE STAY IN THE HOSPITAL:  1. CT scan of the head without contrast, unremarkable CT head, chronic      ethmoid and bimaxillary sinusitis.  2. Abdominal x-ray done on October 15, 2008.  Impression:  Small bowel      obstruction was a small bowel ileus.  3. CT scan of the abdomen and pelvis with contrast.  Impression:      Dilated small bowel loops without discrete transition point.  Small      amount of perihepatic ascites.  CT of the pelvis distal descending      and sigmoid diverticulitis.  4. Abdominal x-ray done on October 16, 2008.  Impression:  Interval      decrease in  the distention and prominence of abdominal small bowel      loops.  Progression of oral contrast into the decompressed colon      since previous CT indicating no high-grade obstruction and      diverticulosis.  5. Gastric emptying time done on October 16, 2008.  Impression:  Normal      gastric emptying examination.  6. Abdominal x-ray done on October 17, 2008.  Impression:  No evidence of      obstruction.   LABORATORY DATA:  Done at the time of discharge total WBC 7.2,  hemoglobin 13.1, hematocrit 39.2, platelets of 170, sodium 136,  potassium 3.5, chloride 104, bicarb 30, glucose 85, BUN 7, creatinine  0.7, total bilirubin 1.8, alkaline phosphatase 50, AST 22, ALT 13, total  protein is 5.0, albumin 2.8, calcium of 7.7.  UA was positive.  Blood  cultures have been no growth to date and urine cultures have shown  10,000 colonies of bacteria and morphotypes, none predominant.   DISPOSITION:  The patient is medically stable to be discharged home.  The patient  is recommended to follow up with his primary care physician  as an outpatient within next 1-2 weeks.      Monte Fantasia, MD  Electronically Signed     MP/MEDQ  D:  10/17/2008  T:  10/18/2008  Job:  [id number]

## 2010-12-24 NOTE — Letter (Signed)
April 06, 2007    Jorge [name]. Jorge [name], M.D.  [address] [place], [place] [address]   RE:  KLEIN, WILLCOX  MRN:  [id number]  /  DOB:  August 26, 1940   Dear Dr. Manus [name],   Thank you for this referral.  As you are aware, Jorge [name] is a  pleasant 70 year old Caucasian gentleman who is referred for an  evaluation of dyspnea and intermittent wheezing.  He reports worsening  dyspnea for about a year.  He can walk to the mailbox and back but has  difficulty climbing stairs.  He reports intermittent wheezing and  difficulty in shaking off a chest cold.  He reports a chronic cough  productive of white to brown phlegm which is more in the mornings.  He  denies chest pain, palpitations, or a history of suggestive of orthopnea  or paroxysmal nocturnal dyspnea.  He underwent a cholecystectomy in  November 2007, and preoperative cardiac evaluation did not show any  evidence of ischemia, as per the patient.  Chest x-ray showed  hyperinflation suggestive of COPD.  He has been maintained on a regimen  of Flovent and Atrovent 2 puffs b.i.d. each.   PAST MEDICAL HISTORY:  Coronary artery disease, status post angioplasty  in April 2003.  Recent negative stress test.   PAST SURGICAL HISTORY:  1. Cholecystectomy.  2. Back surgery x2 about 10 years ago.   ALLERGIES:  None known.   CURRENT MEDICATIONS:  1. Aspirin 81 mg daily.  2. Atenolol 50 mg daily.  3. Vytorin 10/50 mg daily.  4. Flovent 2 puffs b.i.d.  5. Atrovent 2-3 puffs b.i.d.   SOCIAL HISTORY:  He smokes about 8 cigarettes a day, used to smoke about  a pack per day until he retired, started about 50 years ago.  He is  married, and lives with his wife and children.  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   FAMILY HISTORY:  Mother had breast cancer.   REVIEW OF SYSTEMS:  Chronic cough, as described above.  Denies weight  loss or loss of appetite.  Reports some stiffness in his hands.   PHYSICAL  EXAMINATION:  VITAL SIGNS:  Weight 122.6 pounds, blood pressure  110/64, afebrile, heart rate 85 per minute, oxygen saturation 93% on  room air.  HEENT:  No postnasal drip.  NECK:  Supple.  No JVD, no lymphadenopathy.  CVS:  S1, S2 normal.  CHEST:  Decreased breath sounds at both bases.  No rhonchi.  ABDOMEN:  Soft, nontender.  NEUROLOGIC:  Nonfocal.  EXTREMITIES:  No edema.   IMPRESSION:  1. Moderate to severe chronic obstructive pulmonary disease in this      heavy smoker.  2. Coronary artery disease.   RECOMMENDATIONS:  1. Spirometry pre and post will be scheduled.  2. He was started on Spiriva, and HandiHaler technique was      demonstrated to him.  Hopefully, we will be able to improve his      exercise tolerance with this and improve his dyspnea.  He will      continue to use the Flovent and use Atrovent only on a p.r.n.      basis.  3. He seems to have tolerated his beta blockade well, and I would not      discontinue this.  4. Smoking cessation was encouraged, and we will pursue this further      on future visits.    Sincerely,  Jorge Milch, MD  Electronically Signed    RVA/MedQ  DD: 04/06/2007  DT: 04/07/2007  Job #: [id number]   CC:    Jorge [name], M.D.

## 2010-12-27 NOTE — Cardiovascular Report (Signed)
[place]. [facility]  Patient:    Jorge [name], Jorge [name] Visit Number: [id number] MRN: [id number]          Service Type: CAT Location: 6500 [id number] Attending Physician:  Corliss Marcus Dictated by:   Francisca December, M.D. Proc. Date: 01/04/02 Admit Date:  01/04/2002 Discharge Date: 01/05/2002   CC:         Meade Maw, M.D.  Dyanne Carrel, M.D.  Cardiac Catheterization Lab   Cardiac Catheterization  PROCEDURES PERFORMED: 1. Percutaneous transluminal coronary angioplasty/drug-eluding stent    implantation proximal first diagonal. 2. Percutaneous transluminal coronary angioplasty/drug-eluding stent    implantation mid left anterior descending artery. 3. Percutaneous transluminal coronary angioplasty/cutting balloon    circumflex marginal in-stent restenosis.  CARDIOLOGIST:  Francisca December, M.D.  INDICATIONS:  [name] is a 70 year old man with atypical angina who returns now to the cardiac catheterization laboratory to complete full revascularization of two vessel coronary artery disease.  He had successful stenting of the left circumflex coronary artery in March 2003.  He has remained stable and has returned now for drug eluding stent implantation of a long diffuse LAD stenosis involving the bifurcation and proximal portion of the diagonal branch.  DESCRIPTION OF PROCEDURE: PCI was performed to find the percutaneous insertion of a 7 French catheter sheath, utilizing an anterior approach over a guiding J-wire into the right femoral artery.  A 7 French Sci-Med CSL 3.5 guiding catheter was advanced to the ascending aorta where the left coronary os was engaged.  The patient received intracoronary dose of nitroglycerin 0.2 mg. Cine angiography of the left anterior descending was performed in RAO and LAO projections.  The patient received 44.3 mg of Angiomax and we proceeded with coronary intervention.  A 0.14 inch Sci-Med luge  intracoronary guidewire was passed across the lesion in the circumflex marginal without difficulty.  The initial balloon dilatation there was performed with a 3.0/15 mm cutting balloon.  Two inflations were made for 1-1/2 minutes to a peak pressure of 6 atmospheres.  The cutting balloon was removed.  There was a partially occlusive intimal dissection in the proximal diagonal branch.  The wire was allowed to remain in place in the diagonal branch.  A second Sci-Med luge wire was advanced down the anterior descending artery without difficulty.  The anterior descending artery lesion was stented with a 3.0/23 mm Cordis Cypher drug-eluding stent placed in the more distal portion of the long LAD stenosis. It was inflated there to 16 atmospheres for 45 seconds.  The stent balloon was removed.  The diagonal branch stenosis appeared to be worsening.  A 2.5/15 mm Cordis Cypher stent was then advanced into the proximal portion of the diagonal branch.  It was deployed there to 16 atmospheres for 46 seconds. This stent balloon was removed.  A third Cordis Cypher drug-eluding stent was then advanced into the proximal and mid anterior descending artery.  This was a 3.0/18 mm device.  It was allowed to overlap the previously placed more distal stent by about 5 mm.  It was deployed there to a maximum pressure of 16 atmospheres for 47 seconds.  That stent balloon was deflated, advanced slightly and inflated to 22 atmospheres for about 30 seconds.  The 3.0/18 Cypher stent balloon was removed.  The wire was withdrawn and manipulated down the diagonal branch stenosis.  The previously utilized 2.5/13 mm Cypher stent balloon was then advanced into the diagonal branch bifurcation.  It was inflated there to 6  atmospheres for 96 seconds.  This balloon was removed. The wire was withdrawn again into the anterior descending artery and manipulated into the distal portion of the anterior descending artery.  The 3.0/23 mm  Cypher stent balloon was then advanced into the anterior descending artery and inflated to 11 atmospheres for 46 seconds.  These maneuvers resulted in wide patency of the diagonal and LAD stenosis. This was confirmed on orthogonal views without the guidewire in place.  The luge guidewire was then advanced down the left circumflex and across a moderate stenosis in the previously placed left circumflex marginal stent. The cutting balloon was returned to the circulation and placed within the stented segment.  It was inflated to9 a maximum pressure of 8 atmospheres for three minutes, deflated and then reinflated to 8 atmospheres for 1-1/2 minutes.  This wire and balloon were removed.  Confirmation of adequate patency was confirmed in orthogonal views.  The guiding catheter was removed. Hemostasis was achieved by suturing the sheaths in place.  The patient was transported to the recovery area in stable condition with an intact distal pulse.  The ACT following Angiomax was 363 seconds falling to approximately 306 seconds at the completion of the procedure.  ANGIOGRAPHY:  As mentioned, the lesion treated was in the anterior descending artery and was 70-80% stenotic diffuse and approximately 36 mm in length. Following balloon dilatation of stent implantation, there was a 10% residual stenosis in the anterior descending artery.  The diagonal branch stenosis was approximately 80%.  Following balloon dilatation of the stent implantation, there was no residual stenosis, however, the Cypher stent did not cover to the point of bifurcation, and there was some residual stenosis in this 1-2 mm segment.  Finally, the degree of restenosis in the stented segment of the left circumflex marginal was not greater than 60%.  Following balloon dilatation and cutting balloon dilatation, there was no residual stenosis there.  FINAL IMPRESSION:  1. Atherosclerotic coronary vascular disease, two vessel. 2.  Status post successful percutaneous transluminal coronary angioplasty and    drug-eluding stent implantation mid left anterior descending artery. 3. Status post successful percutaneous transluminal coronary angioplasty and    drug-eluding stent implantation proximal diagonal branch. 4. Status post successful cutting balloon dilatation in-stent restenosis    left circumflex marginal. 5. Typical angina was reproduced with device insertion and balloon    inflation. S Dictated by:   Francisca December, M.D. Attending Physician:  Corliss Marcus DD:  01/04/02 TD:  01/05/02 Job: [id number]

## 2010-12-27 NOTE — Discharge Summary (Signed)
[place]. [facility]  Patient:    Jorge [name], Jorge [name] Visit Number: [id number] MRN: [id number]          Service Type: MED Location: 6500 [id number] [id number] Attending Physician:  Meade Maw A Dictated by:   Anselm Lis, N.P. Admit Date:  10/21/2001 Discharge Date: 10/23/2001   CC:         Dyanne Carrel, M.D.   Discharge Summary  DATE OF BIRTH:  05/14/1941  PRIMARY CARE PHYSICIAN:  Dyanne Carrel, M.D.  PROCEDURES: 1. (10/21/01):  Prior to patients admission had coronary angiography at    [facility] revealing a subtotal stenosis of the left    circumflex marginal which is a large major branch on the posterior wall of    the heart.  There was also moderate disease in the left anterior descending    artery diagonal distribution. 2. (10/22/01):  Percutaneous transluminal coronary angioplasty/stent,    circumflex marginal branch, ejection fraction 50 to 60%.  DISCHARGE DIAGNOSES: 1. Coronary atherosclerotic heart disease:  Two vessel.  A 70 year old    gentleman with a six month history of atypical angina who underwent    myocardial effusion scan recently showing reversible defect in the    posterior lateral wall.  Dr. Hillary Bow completed coronary    angiography at [facility] on 10/21/01, revealing a subtotal    stenosis of the left circumflex marginal which is a major branch in the    posterior wall of the heart.  There was also moderate disease in the left    anterior descending artery diagonal distribution.  Coronary artery bypass    graft surgery was considered, he was felt to be a poor candidate for this    because of his severe chronic obstructive pulmonary disease.  He    subsequently was admitted and underwent, on 10/22/01, percutaneous    transluminal coronary angioplasty/stent of his circumflex marginal branch.    The patient tolerated the procedure well, and subsequent hospital course    was  unremarkable. 2. Chronic obstructive pulmonary disease. 3. Tobacco use, one pack per day. 4. History of disk surgery in 1997, right knee operation.  PLAN: 1. The patient was discharged home in stable condition. 2. Counseled for smoking cessation.  DISCHARGE MEDICATIONS:  No change in previous medications except for enteric-coated aspirin 325 mg one q.d., Plavix 75 mg q.d., atenolol 50 mg p.o. q.d.  ACTIVITY:  Per cardiac rehab nurse until seen in clinic by Dr. Corliss Marcus in two weeks.  DIET:  Low fat, low cholesterol, less than 60 g of fat per day, and less than 300 mg of cholesterol per day.  WOUND CARE:  Call for swelling or pain in groin area.  DISCHARGE INSTRUCTIONS:  No driving for five days.  FOLLOWUP: 1. The patient is to call our office [phone]), to be seen by Dr. Amil Amen in    2 to 3 weeks. 2. He also needs to get an appointment for fasting statin profile.  LABORATORY DATA:  White blood cell count 10.2, hemoglobin 14.3, platelets 313. Sodium 140, potassium 4.1, chloride 104, CO2 31, glucose 95, BUN 8, creatinine 0.7, calcium 8.8.  EKG revealed normal sinus rhythm, nonspecific ST changes. Dictated by:   Anselm Lis, N.P. Attending Physician:  Mora Appl DD:  11/03/01 TD:  11/04/01 Job: [id number]

## 2010-12-27 NOTE — Consult Note (Signed)
Jorge [name], MUNYON NO.:  [id number]   MEDICAL RECORD NO.:  [id number]          PATIENT TYPE:  INP   LOCATION:  5731                         FACILITY:  [facility]   PHYSICIAN:  Armanda Magic, M.D.     DATE OF BIRTH:  21-Sep-1940   DATE OF CONSULTATION:  06/11/2006  DATE OF DISCHARGE:  06/13/2006                                 CONSULTATION   CHIEF COMPLAINT:  Present-op clearance for gallbladder surgery.   This a 70 year old male with a history of nausea, vomiting and right  upper quadrant pain.  Gallbladder ultrasound revealed acute  cholecystitis with cholelithiasis.  We are now asked to evaluate  preoperatively secondary to his history of coronary disease.  He does  have a history of coronary disease consisting of a drug-eluting stent to  the D1 and mid LAD, cutting balloon to the circumflex marginal due to  end-stent restenosis in 2003.  Followup Cardiolite in 2004 was improved  from 2003.  There was no evidence of ischemia, and EF was 51%.  Patient  denies any chest pain, PND, orthopnea or palpitations, dizziness,  fatigue.  He is chronically dyspneic and does complain of dyspnea on  exertion which he thinks has worsened in the past year.   ALLERGIES:  None.   Medications include Vytorin 10/20 mg a day, Flovent, albuterol,  Prilosec, atenolol 50 mg a day, baby aspirin.   SOCIAL HISTORY:  He smokes 10 cigarettes per day.  No alcohol use.  No  drugs.  He is married.  [place]   FAMILY HISTORY:  His mother died at 62 of CA.   PAST MEDICAL HISTORY:  Includes coronary disease as stated above,  dyslipidemia, COPD, long-term medication use, history of back and knee  surgery, and BPH.   REVIEW OF SYSTEMS:  Other that what is stated the HPI is negative.   PHYSICAL EXAMINATION:  VITAL SIGNS:  Blood pressure is 132/60, pulse 73,  temperature 99.6, O2 saturations 97% on room air.  HEENT:  Benign.  NECK:  Supple without lymphadenopathy.   Carotid upstrokes +2  bilaterally, no bruits.  LUNGS:  Clear to auscultation throughout.  HEART:  Regular rate and rhythm.  No murmurs, rubs or gallops. Normal S1  and S2.  ABDOMEN:  Benign.  EXTREMITIES:  No edema.   LABORATORY:  Sodium 138, potassium 3.3, chloride 107, bicarb 25, BUN 19,  creatinine 0.8, glucose 138.  White cell count 9.1, hematocrit 40.7,  hemoglobin 14.1, platelet count 225.  Lipase 23.  LFTs are normal.   Chest x-ray shows COPD.   EKG shows sinus rhythm at 61 beats per minute with no ST changes from  prior EKG.   ASSESSMENT:  1. Acute cholecystitis with cholelithiasis.  2. History of known CAD with no angina recently.  He has had some      dyspnea on exertion which is probably related to underlying COPD.  3. Dyslipidemia.  4. COPD.  5. BPH.  6. Hypokalemia, being repleted.  7. Tobacco abuse.   PLAN:  Given the fact that he has had some worsening  of his shortness of  breath, this could be an anginal equivalent.  Apparently he has never  had chest pain the past, it has just always been shortness of breath,  which he has continued to have since his PCI, but he thinks it has  gotten worse recently.  He does continue to smoke, though.  His last  noninvasive ischemic workup was a couple of years ago.  Would recommend  at this time adenosine Cardiolite study prior to surgery to rule out  inducible ischemia.  If it is okay, would go ahead and proceed with  surgery.      Armanda Magic, M.D.  Electronically Signed     TT/MEDQ  D:  06/22/2006  T:  06/22/2006  Job:  [id number]

## 2010-12-27 NOTE — Cardiovascular Report (Signed)
[place]. [facility]  Patient:    Jorge [name], Jorge [name] Visit Number: [id number] MRN: [id number]          Service Type: MED Location: 6500 [id number] 01 Attending Physician:  Mora Appl Dictated by:   Francisca December, M.D. Proc. Date: 10/22/01 Admit Date:  10/21/2001 Discharge Date: 10/23/2001   CC:         Myriam Jacobson A. Fraser Din, M.D.  Dyanne Carrel, M.D.   Cardiac Catheterization  PROCEDURES PERFORMED: 1. Percutaneous transluminal coronary angioplasty stent implantation,    circumflex marginal branch. 2. Right femoral arteriogram. 3. Percutaneous closure right femoral artery.  INDICATIONS: The patient is a 70 year old man with a six month history of atypical angina. He underwent a myocardial perfusion study recently showing reversible defect in the posterolateral wall. Dr. Candace Cruise has completed coronary angiography revealing a subtotal stenosis of the left circumflex marginal, which is a large major branch on the posterior wall of the heart. There is also moderate disease in the LAD diagonal distribution. While coronary artery bypass surgery was concerned, he was felt to be a poor candidate for this because of severe COPD. He therefore is undergoing percutaneous revascularization today of the left circumflex coronary artery.  DESCRIPTION OF PROCEDURE: The patient was brought to the cardiac catheterization laboratory in the postabsorptive state. The right groin was prepped and draped in the usual sterile fashion. Local anesthesia was obtained with the infiltration of 1% lidocaine. A 7 French catheter sheath was inserted percutaneously into the right femoral artery utilizing an anterior approach over a guiding J wire. The patient then received 3000 units of heparin intravenously. A 7 [place], Voda 3.5, Scimed wiseguide guiding catheter was advanced to the ascending aorta where the left coronary os was engaged. A 0.014 inch Scimed luge  intracoronary guide wire was passed across a near complete occlusion in the left circumflex marginal branch with modest difficulty. Initial balloon dilatation was performed with a 2.5/20 mm Scimed Maverick intracoronary balloon inflated to 6 atmospheres for 72 seconds.  This device was removed and a 3.5/20 mm Scimed, Express II intracoronary stent positioned across the lesion. It was deployed there to a peak pressure of 14 atmospheres for 39 seconds. This resulted in wide patency of the circumflex marginal coronary artery and excellent distal flow. The guide wire and guiding catheter were then removed. A left femoral angiogram was performed via the femoral sheath utilizing a hand injection in a 45 degree RAO angulation. This revealed the femoral artery to be widely patent and without significant obstruction. No significant atherosclerotic disease is present. The arteriotomy site was well above the bifurcation into the profundus femoral and the superficial femoral artery.  The catheter sheath was then removed and the arteriotomy closed percutaneously utilizing the Perclose system. There was good hemostasis at completion. The patient was transported to the recovery area in stable condition with an intact distal pulse.  It should be noted the patient received 3000 units of heparin intravenously and his initial ACT was 304 seconds following the 264 seconds at the completion of the procedure.  ANGIOGRAPHY: As mentioned, the lesion treated was in the circumflex marginal which is the only major vessel on the lateral wall of the heart originating from the circumflex system. There was antegrade flow but it was TIMI grade 1 flow. Following balloon dilatation and stent implantation there was wide patency throughout this disease section of the marginal with a nice "stepup and stepdown" seen in the vessel at the stented site.  FINAL IMPRESSION: 1. Atherosclerotic coronary vascular disease,  two-vessel. 2. Status post successful percutaneous transluminal coronary angioplasty and    stent implantation, left circumflex coronary artery. 3. Typical angina was not reproduced with device insertion or balloon    inflation. Dictated by:   Francisca December, M.D. Attending Physician:  Meade Maw A DD:  10/22/01 TD:  10/23/01 Job: [id number]

## 2010-12-27 NOTE — Discharge Summary (Signed)
NAMEAURTHER, HARLIN                         ACCOUNT NO.:  [id number]   MEDICAL RECORD NO.:  [id number]                   PATIENT TYPE:  INP   LOCATION:  5012                                 FACILITY:  [facility]   PHYSICIAN:  Jackie Plum, M.D.             DATE OF BIRTH:  11/21/1940   DATE OF ADMISSION:  08/02/2002  DATE OF DISCHARGE:                                 DISCHARGE SUMMARY   PRIMARY CARE PHYSICIAN:  Dr. Manus Gunning, family practice.   DISCHARGE DIAGNOSES:  1. Chronic obstructive pulmonary disease exacerbation secondary to upper     respiratory infection:  resolved.  2. Upper respiratory infection, likely bronchitis:  on antibiotics.  3. History of coronary artery disease, history of benign prostatic     hypertrophy and history of colon polyps.   MEDICATIONS ON DISCHARGE:  Pravachol 40 mg a day, atenolol 50 mg a day,  aspirin 325 mg a day, Atrovent two puffs q four hours prn, Flovent two puffs  bid, guaifenesin 1,200 mg po bid, prednisone 40 mg a day times five days,  Tequin 400 mg a day times five days.   ALLERGIES:  ERYTHROMYCIN, SUDAFED   PROCEDURE:  None.   HISTORY OF PRESENT ILLNESS:  Patient was directly admitted from his primary  MD's office for a seven day history of head congestion, cough and fever with  mild myalgias.  He was seen in his primary MD's office on 07/28/02 and given  guaifenesin and Phenergan with codeine.  Symptoms continued to worsen.  He  returned to MD's office on 08/02/02 and was found to have chronic  obstructive pulmonary disease exacerbation, was admitted for treatment of  same.   HOSPITAL COURSE:  Patient was admitted to regular bed, provided oxygen,  nebulizer treatments, IV steroids, IV antibiotics and Protonix to prevent  stress ulcers.  EKG at the time of admission showed normal sinus rhythm with  no Q wave abnormalities.  Patient was found to be febrile at the time of  admission, temperature 101.1, blood pressure 122/61, heart  rate 83,  respirations 20, 92% on room air.  A chest x-ray at the time of admission  showed a picture consistent with chronic obstructive pulmonary disease, no  infiltrates or acute disease noted.   Over the course of his admission Mr. Weltz continued to improve.  Room  air saturations during ambulation the day prior to discharge 92%.  Vital  signs at the time of discharge temperature 97.3, blood pressure 108/62,  respirations 20, pulse 74, oxygen saturation 100% room air at rest.  Patient  was discharged home, to continue his antibiotics and steroid course.  Will  need to follow up with his primary medical doctor next week.   LABORATORY DATA:  Sodium 135, potassium 3.5, chloride 188, carbon dioxide  27, glucose 106, BUN 8, creatinine 0.8, potassium 8.8, total protein 7.9,  albumin 3.8, AST 25, ALT 22, alkaline phosphatase 77, bilirubin  1.1, BNP on  this visit was less than 30, white blood cell count 1.1, hemoglobin 15.2,  hematocrit 44, platelet count 235,000, one set of cardiac enzymes was  negative.  Urinalysis and urine culture were negative.   CONSULTATIONS:  None.   CONDITION AT DISCHARGE:  Substantially improved. Stable.   DISPOSITION:  Discharged to home.   FOLLOW UP:  The patient is to follow up with his primary MD sometime next  week.      Ellender Hose. Christian Mate, M.D.    SMD/MEDQ  D:  08/04/2002  T:  08/05/2002  Job:  [id number]   cc:   Bryan Lemma. Manus Gunning, M.D.  [address] [place]  [place] [address]  Fax: [phone]

## 2010-12-27 NOTE — Op Note (Signed)
NAMELEEVI, CULLARS NO.:  [id number]   MEDICAL RECORD NO.:  [id number]          PATIENT TYPE:  INP   LOCATION:  5731                         FACILITY:  [facility]   PHYSICIAN:  Thomas A. Cornett, M.D.DATE OF BIRTH:  1941/04/17   DATE OF PROCEDURE:  06/12/2006  DATE OF DISCHARGE:                                 OPERATIVE REPORT   PREOPERATIVE DIAGNOSIS:  Acute cholecystitis.   POSTOPERATIVE DIAGNOSIS:  Acute cholecystitis.   PROCEDURE:  Laparoscopic cholecystectomy with intraoperative cholangiogram.   SURGEON:  Thomas Cornett.   ASSISTANT:  Dr. Avel Peace.   ANESTHESIA:  General endotracheal anesthesia with 20 mL of 0.5% Sensorcaine  local with epinephrine.   ESTIMATED BLOOD LOSS:  100 mL.   DRAIN:  One 59 Blake drain to gallbladder fossa.   INDICATIONS FOR PROCEDURE:  The patient is a 70 year old male admitted with  acute cholecystitis.  He was brought to the operating room today due to  acute cholecystitis for laparoscopic cholecystectomy after receiving cardiac  clearance.  Procedure discussed with the patient as well as complications of  bleeding, infection, common duct injury, injury to other organs.  He  understood and agreed to proceed.   DESCRIPTION OF PROCEDURE:  The patient was brought to the operating room and  placed supine.  After induction of general endotracheal anesthesia, the  abdomen prepped and draped in a sterile fashion.  A 1-cm supraumbilical  incision was made, and dissection was carried down to his fascia.  A small  incision was made in the midline fascia to expose the umbilicus.  Kochers  were used to grasp the fascial edges, and I used my finger to push through  the peritoneal lining to enter the abdominal cavity bluntly.  I did not feel  any intra-abdominal adhesions.  Pursestring suture of 0 Vicryl was placed,  and a 12-mm Hassan cannula was placed under direct vision.  Pneumoperitoneum  was created to 15 mmHg of CO2, and a  laparoscope was placed.  The patient  was placed in reversed Trendelenburg and rolled to his left.  Of note, he  had significant hepatomegaly.  The gallbladder was identified.  It was  acutely inflamed and edematous.  A Nazot suction catheter was used to  decompress the gallbladder.  A 12-mm subxiphoid port was placed under direct  vision.  Two 5 mL ports were placed in the right mid abdomen.  A Nazot  sucker was used through the ports after these were placed to decompress the  gallbladder.  Once this was done, we were able to grasp it.  The dome was  grasped and retracted toward the patient's right shoulder.  The infundibulum  was identified and pulled toward the patient's right lower quadrant.  The  case was extremity due to his hepatomegaly.  While cleaning the camera off,  the camera was bumped against the liver, and a small laceration about 3 cm  was noted just to the left of the gallbladder.  I controlled this with  Surgicel.  I was then able to begin my dissection around the infundibulum of  the gallbladder.  I was able to dissect out the cystic duct.  This was the  only tubular structure entering the gallbladder.  A clip was placed in the  gallbladder side of the cystic duct.  A small incision was made through a  separate stab incision, and a Cook cholangiogram catheter was introduced and  placed in the cystic duct for intraoperative cholangiogram.  Cholangiogram  was performed which showed a very long tortuous cystic duct flowing into a  common duct with free flow into the duodenum.  There was free flow of  contrast up the common hepatic duct to the bifurcation of the right and left  hepatic duct without signs of stones, stricture or any other forms of  obstruction or f extravasation.  Cholangiogram was complete.  Catheter was  removed.  The cystic duct stump was triple clipped and divided.  There were  small branches of the cystic artery which were controlled with clips.  Once  we  controlled the cystic artery and its branches, we were able to use the  cautery to dissect gallbladder from the gallbladder bed.  The gallbladder  was partially intrahepatic.  Once the gallbladder was dissected out, we  placed it in an EndoCatch bag.  We then inspected the gallbladder bed and  the laceration and found these to be relatively hemostatic.  More Surgicel  was placed with excellent hemostasis.  Irrigation was used and suctioned  out.  Given the amount of inflammation, I elected to place a 19 plate Blake  drain and did this through the most lateral laparoscopic port and had it  sitting in the gallbladder fossa.  The gallbladder was then extracted.  I  had to enlarge the umbilical incision in order to extract the gallbladder  with a very large stone.  Once we did this and passed the gallbladder off  the field, I closed the fascia at the umbilicus with an additional stitch of  0 Vicryl.  After this was done, the drain was hooked to bulb drainage.  The  remainder of the irrigation was suctioned out.  I inspected the abdominal  cavity and saw no signs of bowel or bladder injury at this point in time, or  any other solid organ injury other than that stated above.  At this point in  time, the drain was secured with 3-0 nylon.  The CO2 was released.  The  ports were subsequently removed.  The skin incisions were closed with 4-0  Monocryl.  Sterile dressings were applied.  All final counts of sponge,  needle and instruments found be correct at this portion case.  The patient  was awakened and taken to recovery in satisfactory condition.      Thomas A. Cornett, M.D.  Electronically Signed     TAC/MEDQ  D:  06/12/2006  T:  06/12/2006  Job:  [id number]

## 2010-12-27 NOTE — H&P (Signed)
[facility]  Patient:    Jorge [name], Jorge [name] Visit Number: [id number] MRN: [id number]          Service Type: MED Location: [phone] Attending Physician:  Mora Appl Dictated by:   Meade Maw, M.D. Admit Date:  10/21/2001                           History and Physical  REFERRING PHYSICIAN:  Dr. Dyanne Carrel.  REASON FOR ADMISSION:  Unstable angina.  HISTORY:  Jorge [name] is a pleasant 70 year old gentleman who had onset of chest pain in the latter part of December.  The chest pain was described as an 8/10 and persisted for approximately 2 days.  He had recurrence of the chest pain at the first of February and was subsequently referred for further evaluation.  There was no associated nausea, vomiting, diaphoresis and no increase in shortness of breath.  He is noted to be short of breath at baseline, which is attributable to his COPD.  A stress Cardiolite was performed.  He was noted to have reversible ischemia in the posterior wall. Left heart catheterization was subsequently performed and he was noted to have significant disease in the circumflex and the proximal LAD; please see the cath dictation.  PAST MEDICAL HISTORY:  Significant for COPD.  PAST SURGICAL HISTORY:  Significant for disk surgery in 1997 and a right knee operation.  SOCIAL HISTORY:  He is married and lives with his wife.  He works as a Production manager.  He enjoys outside activity.  He continues to smoke one pack per day. There is no history of alcohol or illicit drug use.  FAMILY HISTORY:  Significant for cancer; mother passed at age 61.  ALLERGIES:  He has no known drug allergies.  CURRENT MEDICATIONS: 1. Atrovent MDI. 2. Vanceril MDI. 3. Aspirin 325 mg daily.  REVIEW OF SYSTEMS:  The patient notes that he has had a significant weight loss, from 165 to 124.  This has been extensively evaluated in the past.  PHYSICAL EXAMINATION:  GENERAL:   Physical exam reveals an elderly male in no acute distress.  He has recently undergone cardiac catheterization.  VITAL SIGNS:  Blood pressure is 106/70.  His heart rate is 80.  HEENT:  Unremarkable.  NECK:  He had good carotid upstrokes.  No carotid bruits.  Thyroid is not palpable.  LUNGS:  Pulmonary exam reveals breath sounds which are diminished and clear to auscultation.  CARDIOVASCULAR:  Regular rate and rhythm, normal S1, normal S2.  No rubs, murmurs or gallops are noted.  ABDOMEN:  Soft and benign.  No unusual bruits or pulsations are noted.  EXTREMITIES:  Distal pulses which are 1+ bilaterally.  There is no peripheral edema noted.  NEUROLOGIC:  Nonfocal.  SKIN:  Warm and dry.  He has a dressing over his right groin cath.  LABORATORY AND ACCESSORY DATA:  His glucose is 87, BUN is 10, creatinine is 0.8.  Normal CBC.  Normal INR.  ECG reveals a normal sinus rhythm, nonspecific ST changes.  FINAL IMPRESSION: 1. Recurrent chest pain with identifiable disease in the circumflex and the    proximal left anterior descending:  The patient will be admitted to [facility].  Dr. Francisca December will be consulted for possible intervention.    He will be continued on nitrates.  He will be loaded with Plavix 150 mg  today and in the a.m., he will be started on Lovenox. 2. Chronic obstructive pulmonary disease:  He will be continued on his    Atrovent. 3. Health maintenance:  Fasting lipid profile will be obtained. Dictated by:   Meade Maw, M.D. Attending Physician:  Meade Maw A DD:  10/21/01 TD:  10/22/01 Job: [id number]

## 2010-12-27 NOTE — Op Note (Signed)
Jorge [name], Jorge [name]                         ACCOUNT NO.:  [id number]   MEDICAL RECORD NO.:  [id number]                   PATIENT TYPE:  AMB   LOCATION:  ENDO                                 FACILITY:  [facility]   PHYSICIAN:  Petra Kuba, M.D.                 DATE OF BIRTH:  July 26, 1941   DATE OF PROCEDURE:  04/08/2004  DATE OF DISCHARGE:                                 OPERATIVE REPORT   PROCEDURE PERFORMED:  Colonoscopy with polypectomy.   ENDOSCOPIST:  Petra Kuba, M.D.   INDICATIONS FOR PROCEDURE:  Patient with history of colon polyps due for  repeat screening.  Consent was signed after the risks, benefits, methods and  options were thoroughly discussed in the office.   MEDICINES USED:  Demerol 65 mg, Versed 6.5 mg.   DESCRIPTION OF PROCEDURE:  Rectal inspection was pertinent for external  hemorrhoids, small.  Digital exam was negative.  A video pediatric  adjustable colonoscope was inserted and fairly easily advanced around the  colon to the cecum.  This did not require any abdominal pressure or any  position changes other than left-sided diverticula.  No abnormalities were  seen on insertion.  The cecum was identified by the appendiceal orifice and  the ileocecal valve.  The scope was slowly withdrawn.  The prep was  adequate.  There was some liquid stool that required washing and suctioning.  On slow withdrawal through the colon, the cecum, ascending and transverse  were normal.  The scope was withdrawn around the left side of the colon.  Multiple hyperplastic appearing polyps were seen on the left side, most of  which were hot biopsied but not all.  The left-sided moderate sigmoid  diverticula were confirmed.  Once back in the rectum, anorectal pullthrough  and retroflexion confirmed some small hemorrhoids.  The scope was reinserted  a short ways up the left side of the colon,  air was suctioned, scope removed.  The patient tolerated the procedure well.  There was no  immediate obvious complication.   ENDOSCOPIC DIAGNOSIS:  1. Internal and external hemorrhoids.  2. Left-sided diverticula.  3. Multiple hyperplastic appearing left-sided polyps.  Most were hot     biopsied.  4. Otherwise within normal limits to the cecum.   PLAN:  Await pathology.  Probably will check screening in five years.  Happy  to see back p.r.n.  Otherwise return care to Dr. Manus Gunning for the customary  health care maintenance to include yearly rectals and guaiacs.                                               Petra Kuba, M.D.    MEM/MEDQ  D:  04/08/2004  T:  04/08/2004  Job:  [id number]   cc:  Bryan Lemma. Manus Gunning, M.D.  [address] [place]  [place] [address]  Fax: [phone]   Meade Maw, M.D.  [address]  [place]  [place] [address]  Fax: [phone]

## 2010-12-27 NOTE — H&P (Signed)
NAMENIL, Jorge NO.:  [id number]   MEDICAL RECORD NO.:  [id number]          PATIENT TYPE:  INP   LOCATION:  5733                         FACILITY:  [facility]   PHYSICIAN:  Ollen Gross. Vernell Morgans, M.D. DATE OF BIRTH:  28-Mar-1941   DATE OF ADMISSION:  06/11/2006  DATE OF DISCHARGE:                              HISTORY & PHYSICAL   HISTORY OF PRESENT ILLNESS:  Jorge [name] is a 70 year old white male  who presents with right upper quadrant pain that started last night.  The pain has been very severe in nature.  It has been associated with  nausea and vomiting, but no chest pain or shortness of breath.  He has  had a similar pain in the past, but never to the severity that the pain  is now.  He otherwise denies any chest pain, shortness of breath,  diarrhea, or dysuria.  No fever or chills.  The rest of his review of  systems is unremarkable.   PAST MEDICAL HISTORY:  1. Coronary artery disease.  2. COPD.  3. Emphysema.   PAST SURGICAL HISTORY:  Back surgery.   CURRENT MEDICATIONS:  1. Flovent.  2. Albuterol.  3. Atenolol.  4. Vytorin.   ALLERGIES:  NO KNOWN DRUG ALLERGIES.   SOCIAL HISTORY:  He smokes about 1/2 pack of cigarettes for the last 40  years.  He denies any alcohol use.   FAMILY HISTORY:  Noncontributory.   PHYSICAL EXAMINATION:  VITAL SIGNS:  Temperature 97.1, blood pressure  123/64, pulse is 65.  GENERAL:  He is a well-developed, well-nourished elderly white male in  no acute distress.  SKIN:  Warm and dry.  No jaundice.  HEENT:  Eyes:  His extraocular muscles are intact.  Pupils are equal,  round, and reactive to light.  Sclerae are nonicteric.  LUNGS:  Clear bilaterally.  No use of accessory respiratory muscles.  HEART:  Regular rate and rhythm with an impulse in the left chest.  ABDOMEN:  Soft, but focally tender in the right upper quadrant.  Peritonitis or guarding.  No palpable mass or hepatosplenomegaly.  EXTREMITIES:  No cyanosis,  clubbing, or edema with good strength in his  arms and legs.  PSYCHOLOGIC:  He is alert and oriented x3 with no evidence of anxiety or  depression.   LABORATORY DATA:  He had normal liver functions, a normal lipase, and a  normal white count.  On review of his ultrasound with the radiologist,  it did show a large stone infected in the neck of the gallbladder and  some gallbladder wall thickening, but no ductal dilatation.   ASSESSMENT AND PLAN:  This is a 70 year old white male with what appears  to be cholecystitis with cholelithiasis.  I suspect that he will benefit  from a cholecystectomy during this hospitalization.  Because of his  history of coronary artery disease, we will contact his cardiologist,  Dr. Armanda Magic, to come see him before taking him to the operating  room and try to plan for his surgery later today.  I have explained to  him in detail the  risks and benefits of the operation, as well as some  of the technical aspects, and he understands and wishes to proceed.      Ollen Gross. Vernell Morgans, M.D.  Electronically Signed     PST/MEDQ  D:  06/11/2006  T:  06/11/2006  Job:  [id number]

## 2011-03-13 ENCOUNTER — Ambulatory Visit (INDEPENDENT_AMBULATORY_CARE_PROVIDER_SITE_OTHER): Payer: Medicare Other | Admitting: Pulmonary Disease

## 2011-03-13 ENCOUNTER — Encounter: Payer: Self-pay | Admitting: Pulmonary Disease

## 2011-03-13 VITALS — BP 110/70 | HR 66 | Temp 97.9°F | Ht 66.5 in | Wt 143.6 lb

## 2011-03-13 DIAGNOSIS — J449 Chronic obstructive pulmonary disease, unspecified: Secondary | ICD-10-CM

## 2011-03-13 NOTE — Progress Notes (Signed)
  Subjective:    Patient ID: Jorge [name], male    DOB: 01-28-1941, 70 y.o.   MRN: [id number]  HPI 69/M with CAD for FU of COPD  FEV1 38% in 2008 Adm 3/22 -3/30/ 11 for Right upper lobe pneumonia (methicillin-resistant Staphylococcus aureus).  Readm 4/4 -11/24/09 for rt pneumothorax & persistent air leak -good resolution on FU imaging  Quit smoking since feb '11. His wife & daughter continue to smoke  Spiriva trial --like atrovent better  XR nov'11 >Stable COPD and asymmetric right apical pleural - parenchymal scarring. No acute findings.    03/13/2011 Hot weather makes breathing worse Does not remember whether he has taken pneumonia shot Episode of MRSA cellulitis  Denies chest pain, orthopnea, hemoptysis, fever, n/v/d, edema, headache.  Wears out at times but tries to stay active. Mows grass x witih push mower and has small garden.   Review of Systems Pt denies any significant  nasal congestion or excess secretions, fever, chills, sweats, unintended wt loss, pleuritic or exertional cp, orthopnea pnd or leg swelling.  Pt also denies any obvious fluctuation in symptoms with weather or environmental change or other alleviating or aggravating factors.    Pt denies any increase in rescue therapy over baseline, denies waking up needing it or having early am exacerbations or coughing/wheezing/ or dyspnea      Objective:   Physical Exam Gen. Pleasant, thin male, in no distress ENT - no lesions, no post nasal drip Neck: No JVD, no thyromegaly, no carotid bruits Lungs: no use of accessory muscles, no dullness to percussion, decreased  without rales or rhonchi  Cardiovascular: Rhythm regular, heart sounds  normal, no murmurs or gallops, no peripheral edema Musculoskeletal: No deformities, no cyanosis or clubbing         Assessment & Plan:

## 2011-03-13 NOTE — Patient Instructions (Signed)
Trial of symbicort 160 2 puffs twice daily INSTEAD of FLOVENT  - call me if this works

## 2011-03-14 NOTE — Assessment & Plan Note (Addendum)
Trial of symbicort instead of flovent Pneumovax recommended

## 2011-05-03 IMAGING — CR DG CHEST 1V PORT
1 series · 1 of 1 positions shown · non-contrast
Comparison: 10/18/2009

CLINICAL DATA: Fever, cough, weakness

PORTABLE CHEST - 1 VIEW

[view not recorded]
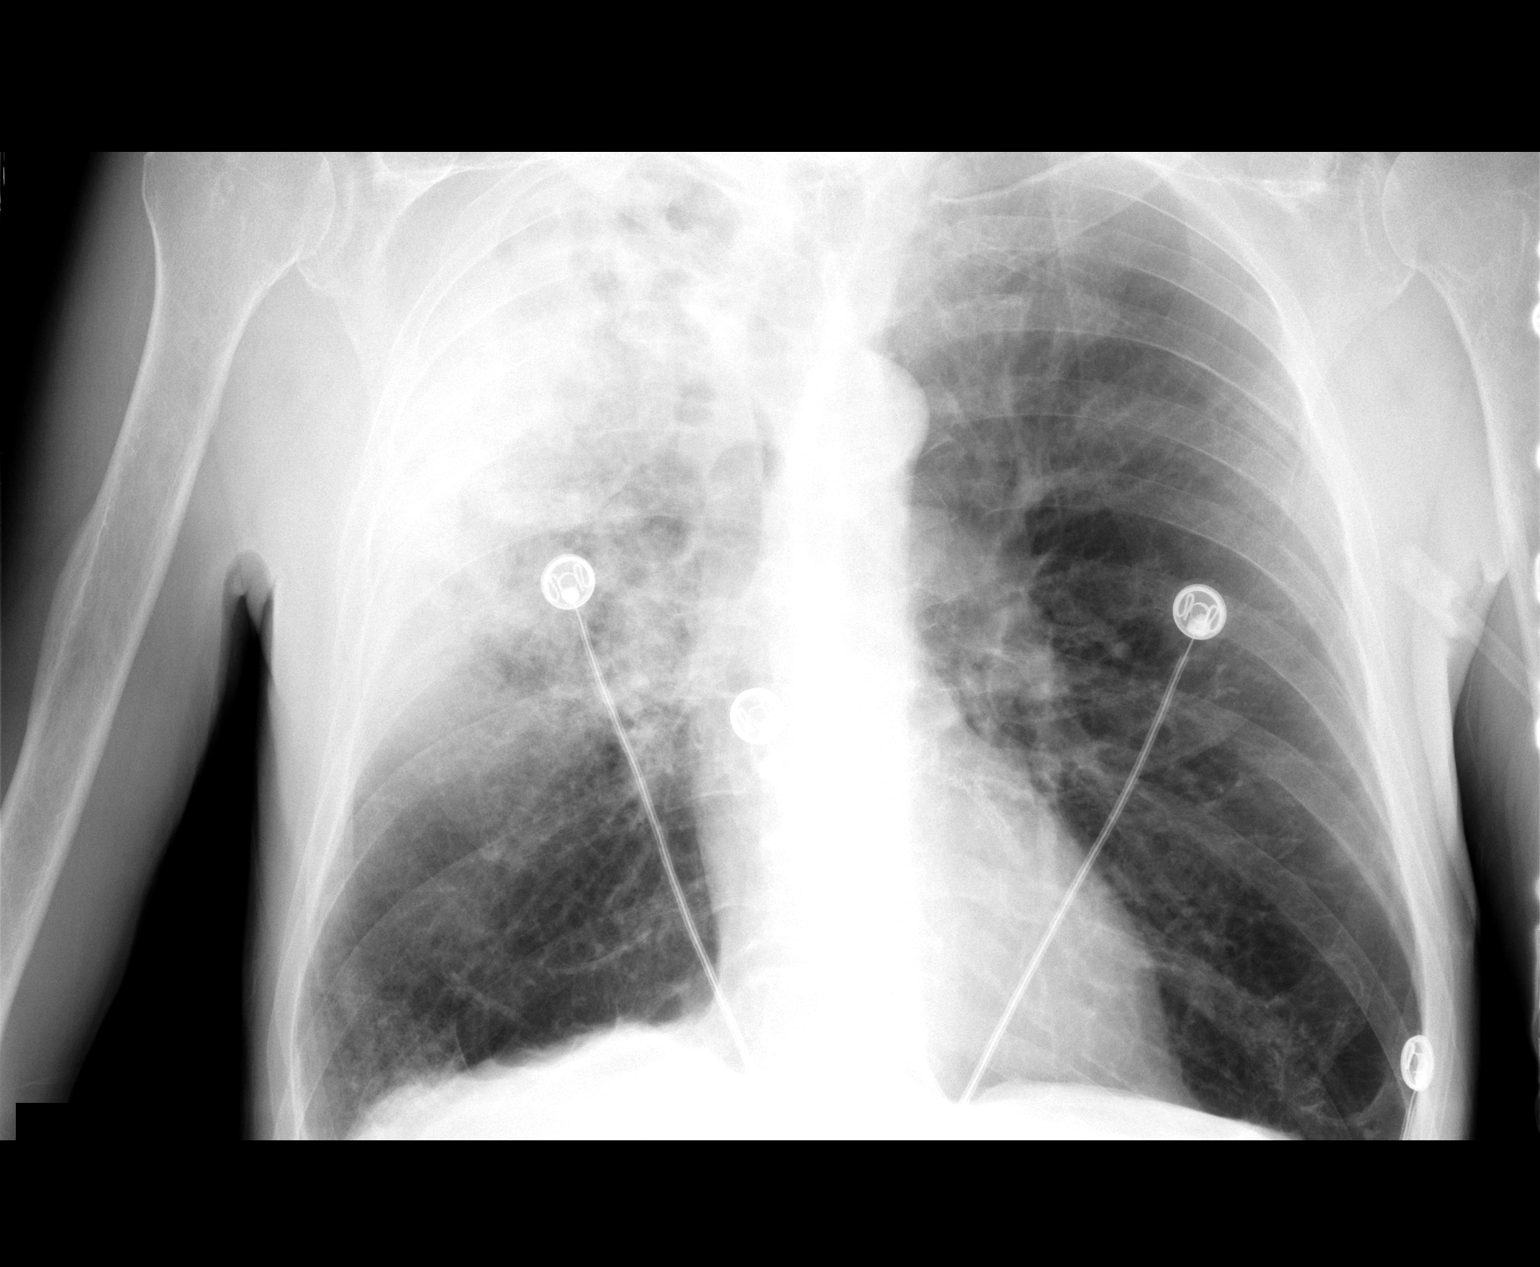

[1 of 1 positions shown; findings below may reference images not displayed]

FINDINGS: Extensive consolidative right upper lobe airspace
process, slightly worse than 10/26/2009 consistent with
consolidative pneumonia.  Background emphysema noted
hyperinflation.  No large effusion or pneumothorax.  Normal heart
size and vascularity.
IMPRESSION: Extensive consolidative right upper lobe pneumonia, slightly worse.

## 2011-05-07 IMAGING — CR DG CHEST 2V
2 series · 2 of 2 positions shown · non-contrast
Comparison: 11/01/2009

CLINICAL DATA: Follow up pneumonia and shortness of breath

CHEST - 2 VIEW

[w chest pa]
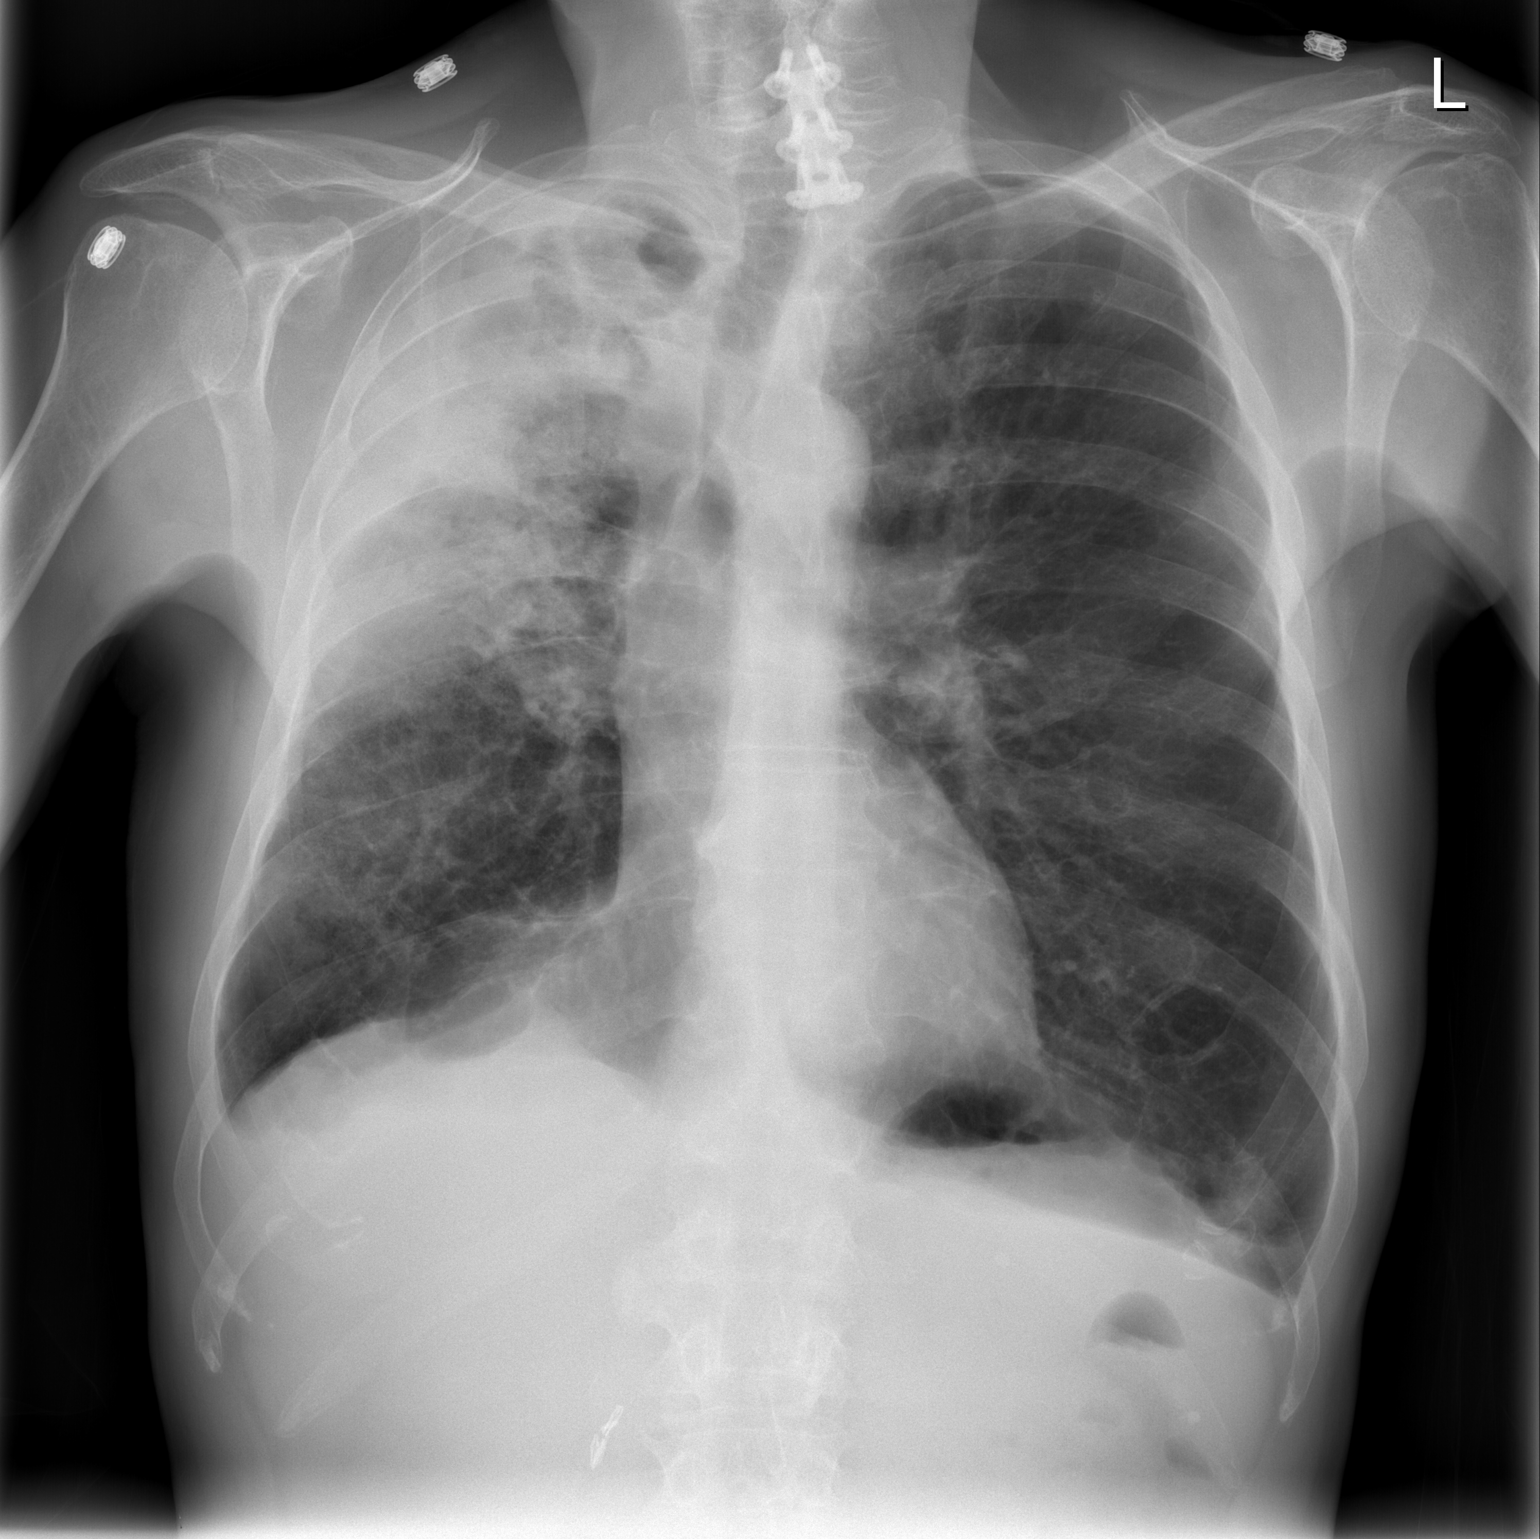

[w chest lat]
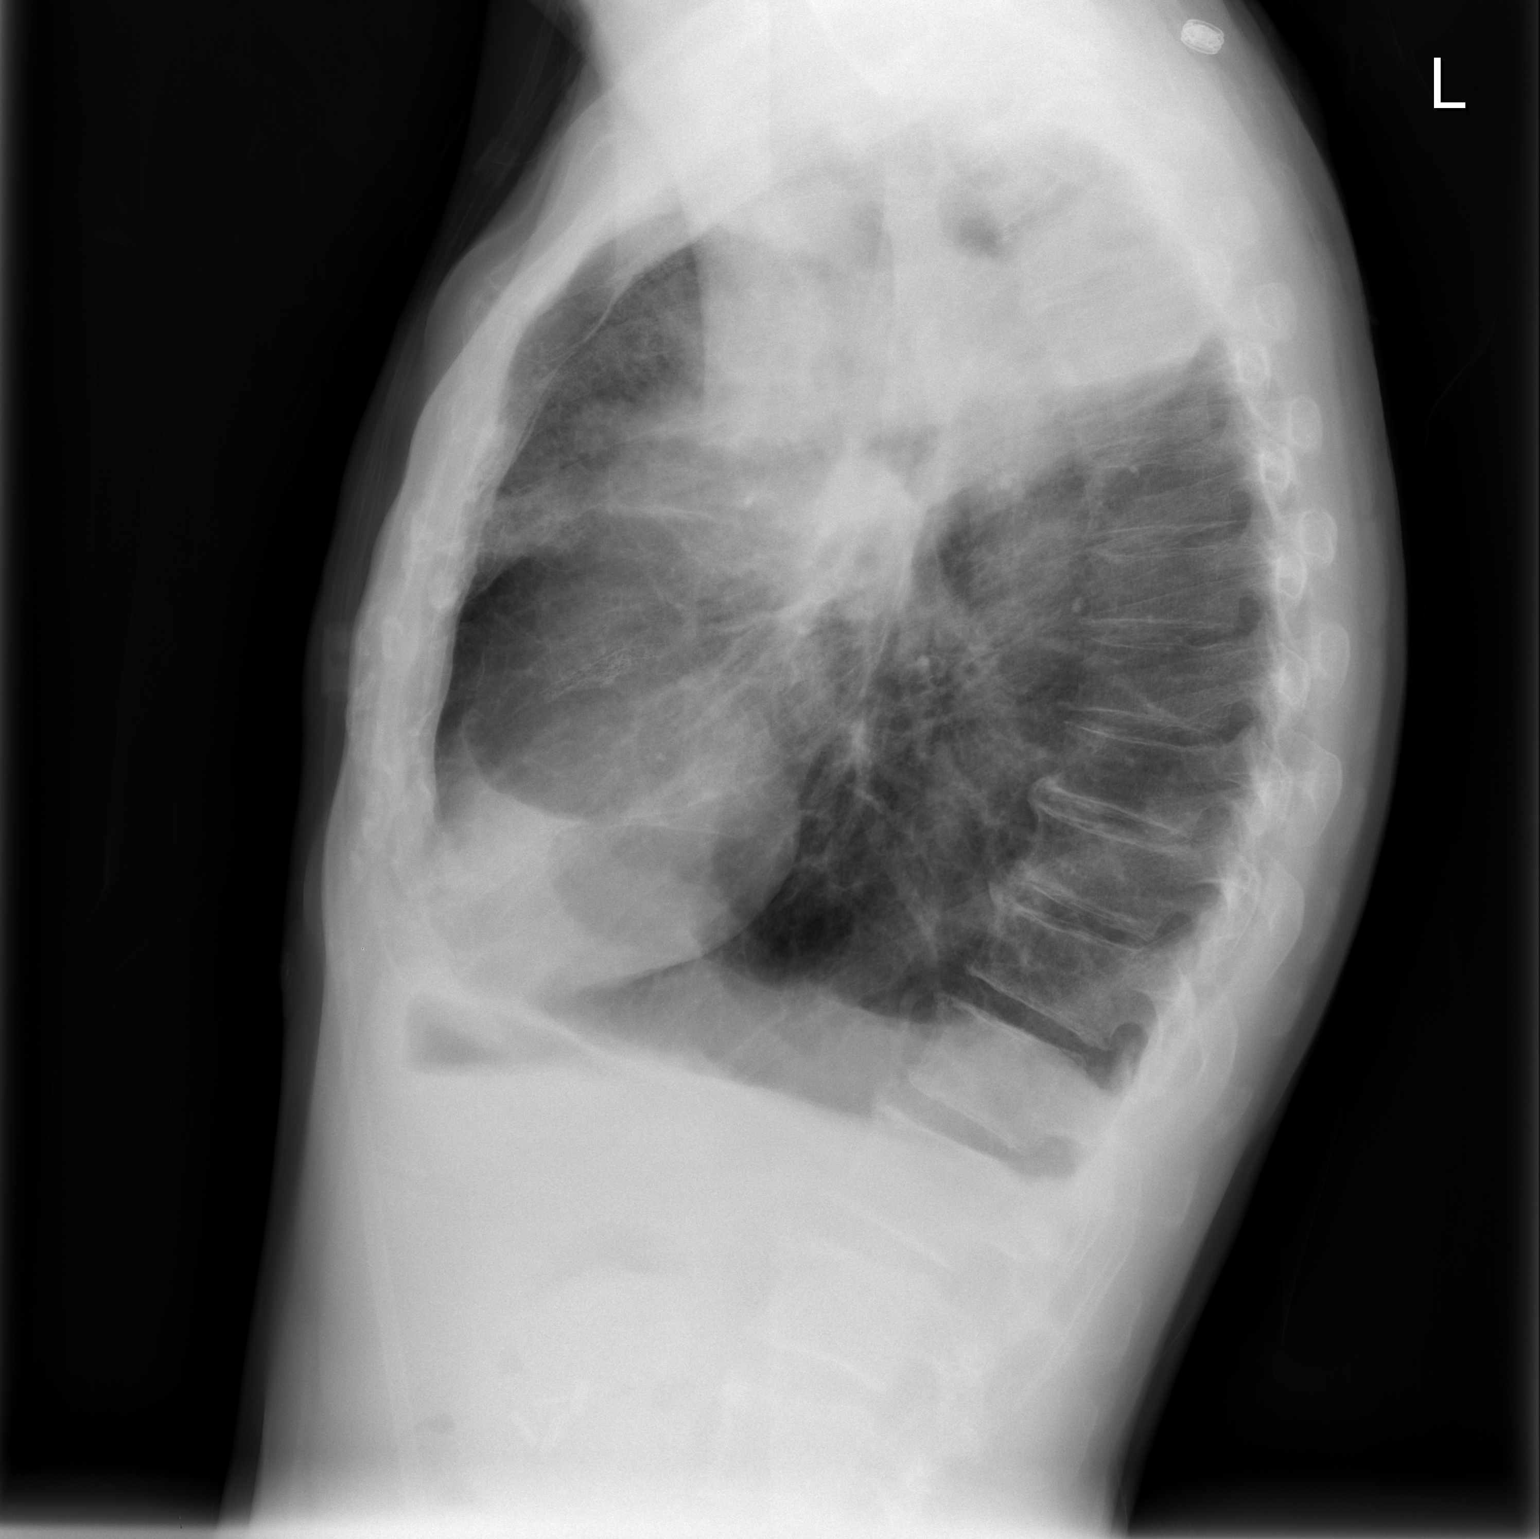

[2 of 2 positions shown; findings below may reference images not displayed]

FINDINGS: Heart size is normal.

Small pleural effusions are again noted, right greater than left.

Dense airspace consolidation within the right midlung and right
upper lobe is unchanged.

The left lung remains clear.
IMPRESSION: 1.  No change in aeration to the right lung compared with prior
exam.

## 2011-05-26 ENCOUNTER — Other Ambulatory Visit: Payer: Self-pay | Admitting: Family Medicine

## 2011-05-26 ENCOUNTER — Ambulatory Visit
Admission: RE | Admit: 2011-05-26 | Discharge: 2011-05-26 | Disposition: A | Discharge status: Home / Self Care | Payer: Medicare Other | Source: Ambulatory Visit | Attending: Family Medicine | Admitting: Family Medicine

## 2011-05-26 DIAGNOSIS — R05 Cough: Secondary | ICD-10-CM

## 2011-05-26 DIAGNOSIS — R509 Fever, unspecified: Secondary | ICD-10-CM

## 2012-05-28 ENCOUNTER — Ambulatory Visit (INDEPENDENT_AMBULATORY_CARE_PROVIDER_SITE_OTHER)
Admission: RE | Admit: 2012-05-28 | Discharge: 2012-05-28 | Disposition: A | Discharge status: Home / Self Care | Payer: Medicare Other | Source: Ambulatory Visit | Attending: Adult Health | Admitting: Adult Health

## 2012-05-28 ENCOUNTER — Encounter: Payer: Self-pay | Admitting: Adult Health

## 2012-05-28 ENCOUNTER — Ambulatory Visit (INDEPENDENT_AMBULATORY_CARE_PROVIDER_SITE_OTHER): Payer: Medicare Other | Admitting: Adult Health

## 2012-05-28 VITALS — BP 124/58 | HR 74 | Temp 96.8°F | Ht 66.0 in | Wt 141.8 lb

## 2012-05-28 DIAGNOSIS — J449 Chronic obstructive pulmonary disease, unspecified: Secondary | ICD-10-CM

## 2012-05-28 MED ORDER — ALBUTEROL SULFATE (2.5 MG/3ML) 0.083% IN NEBU: 2.5000 mg | INHALATION_SOLUTION | Freq: Four times a day (QID) | RESPIRATORY_TRACT | Status: DC | PRN

## 2012-05-28 MED ORDER — IPRATROPIUM BROMIDE HFA 17 MCG/ACT IN AERS: 2.0000 | INHALATION_SPRAY | Freq: Four times a day (QID) | RESPIRATORY_TRACT | Status: DC

## 2012-05-28 MED ORDER — DOXYCYCLINE HYCLATE 100 MG PO TABS: 100.0000 mg | ORAL_TABLET | Freq: Two times a day (BID) | ORAL | Status: AC

## 2012-05-28 NOTE — Patient Instructions (Addendum)
Doxycycline 100mg  Twice daily  For 7 days -take with food.  Mucinex DM Twice daily  As needed  Cough/congestion  Fluids and rest  Increase Atrovent Inhaler 2 puffs Four times a day   Continue on Flovent Inhaler 2 puffs Twice daily   follow up Dr. Vassie Loll  In 2 months and As needed   Please contact office for sooner follow up if symptoms do not improve or worsen or seek emergency care  I will call with xray results.

## 2012-05-28 NOTE — Progress Notes (Signed)
  Subjective:    Patient ID: Jorge [name], male    DOB: 07-05-1941, 71 y.o.   MRN: [id number]  HPI  70/M with CAD for FU of COPD  FEV1 38% in 2008 Adm 3/22 -3/30/ 11 for Right upper lobe pneumonia (methicillin-resistant Staphylococcus aureus).  Readm 4/4 -11/24/09 for rt pneumothorax & persistent air leak -good resolution on FU imaging  Quit smoking since feb '11. His wife & daughter continue to smoke  Spiriva trial --like atrovent better  XR nov'11 >Stable COPD and asymmetric right apical pleural - parenchymal scarring. No acute findings.    8/2 /2012  Hot weather makes breathing worse Does not remember whether he has taken pneumonia shot Episode of MRSA cellulitis  Denies chest pain, orthopnea, hemoptysis, fever, n/v/d, edema, headache.  Wears out at times but tries to stay active. Mows grass x witih push mower and has small garden. >>Trial of symbicort   05/28/2012 Acute OV  Complains of occ prod cough with white/yellow mucus, tightness, increased SOB, wheezing x1 month. No otc used.  Last ov was given Symbicort , he did not feel any different. So went back to Flovent  Not seen > 84yr, no ER/Hospital visits.  Feels breathing/dyspnea level/DOE slowly going down hill over last 1 year.  Wife passed away from blood clot 6 months ago- very tough year.  He denies any hemoptysis, orthopnea, PND, or leg swelling. No weight loss There remains very active, working outside in the yard, shopping and cleaning his home    Review of Systems Constitutional:   No  weight loss, night sweats,  Fevers, chills, fatigue, or  lassitude.  HEENT:   No headaches,  Difficulty swallowing,  Tooth/dental problems, or  Sore throat,                No sneezing, itching, ear ache, nasal congestion, post nasal drip,   CV:  No chest pain,  Orthopnea, PND, swelling in lower extremities, anasarca, dizziness, palpitations, syncope.   GI  No heartburn, indigestion, abdominal pain, nausea, vomiting,  diarrhea, change in bowel habits, loss of appetite, bloody stools.   Resp:   No coughing up of blood.    No chest wall deformity  Skin: no rash or lesions.  GU: no dysuria, change in color of urine, no urgency or frequency.  No flank pain, no hematuria   MS:  No joint pain or swelling.  No decreased range of motion.  No back pain.  Psych:    No memory loss.         Objective:   Physical Exam  Gen. Pleasant, thin male, in no distress ENT - no lesions, no post nasal drip Neck: No JVD, no thyromegaly, no carotid bruits Lungs: no use of accessory muscles, no dullness to percussion, decreased in bases  without rales or rhonchi  Cardiovascular: Rhythm regular, heart sounds  normal, no murmurs or gallops, no peripheral edema Musculoskeletal: No deformities, no cyanosis or clubbing         Assessment & Plan:

## 2012-05-28 NOTE — Progress Notes (Signed)
Quick Note:  LMOM TCB x1. ______ 

## 2012-05-28 NOTE — Assessment & Plan Note (Signed)
Exacerbation  Check xray  Advised to get flu shot next week  Plan Doxycycline 100mg  Twice daily  For 7 days -take with food.  Mucinex DM Twice daily  As needed  Cough/congestion  Fluids and rest  Increase Atrovent Inhaler 2 puffs Four times a day   Continue on Flovent Inhaler 2 puffs Twice daily   follow up Dr. Vassie Loll  In 2 months and As needed   Please contact office for sooner follow up if symptoms do not improve or worsen or seek emergency care  I will call with xray results.

## 2012-05-28 NOTE — Progress Notes (Signed)
Quick Note:  Patient returned call. Advised of lab results / recs as stated by TP. Pt verbalized understanding and denied any questions. ______ 

## 2012-07-29 ENCOUNTER — Encounter: Payer: Self-pay | Admitting: Pulmonary Disease

## 2012-07-29 ENCOUNTER — Ambulatory Visit (INDEPENDENT_AMBULATORY_CARE_PROVIDER_SITE_OTHER): Payer: Medicare Other | Admitting: Pulmonary Disease

## 2012-07-29 VITALS — BP 122/74 | HR 78 | Temp 97.1°F | Ht 66.0 in | Wt 146.8 lb

## 2012-07-29 DIAGNOSIS — J449 Chronic obstructive pulmonary disease, unspecified: Secondary | ICD-10-CM

## 2012-07-29 DIAGNOSIS — F329 Major depressive disorder, single episode, unspecified: Secondary | ICD-10-CM

## 2012-07-29 DIAGNOSIS — J4489 Other specified chronic obstructive pulmonary disease: Secondary | ICD-10-CM

## 2012-07-29 DIAGNOSIS — F32A Depression, unspecified: Secondary | ICD-10-CM

## 2012-07-29 NOTE — Patient Instructions (Addendum)
Trial of symbicort  2 puffs twice daily- take instead of flovent OK to stop taking atrovent - this is drying you out. You need to see a counselor for depression

## 2012-07-29 NOTE — Assessment & Plan Note (Signed)
Trial of symbicort  2 puffs twice daily- take instead of flovent OK to stop taking atrovent - this is drying you out. You need to see a counselor for depression 

## 2012-07-29 NOTE — Progress Notes (Signed)
  Subjective:    Patient ID: Jorge [name], male    DOB: May 22, 1941, 71 y.o.   MRN: [id number]  HPI 70/M with CAD for FU of COPD  FEV1 38% in 2008  Adm 3/22 -3/30/ 11 for Right upper lobe pneumonia (methicillin-resistant Staphylococcus aureus).  Readm 4/4 -11/24/09 for rt pneumothorax & persistent air leak -good resolution on FU imaging  Quit smoking since feb '11.  Spiriva trial --like atrovent better     07/29/2012 1 yr FU  05/28/2012 Acute OV  -CXR ok, doxy , nebs   Last ov was given Symbicort , he did not feel any different. So went back to Flovent  Not seen > 12yr, no ER/Hospital visits.  Feels breathing/dyspnea level/DOE slowly going down hill over last 1 year.  Wife passed away from blood clot 9 months ago- very tough year, children not much support.  He denies any hemoptysis, orthopnea, PND, or leg swelling. No weight loss  There remains very active, working outside in the yard, shopping and cleaning his home   breathing unchanged. cough w/ white-yellow phlem, wheezing and chest tx all the time Mouth stays dry - on regimen of flovent & atrovent    Review of Systems neg for any significant sore throat, dysphagia, itching, sneezing, nasal congestion or excess/ purulent secretions, fever, chills, sweats, unintended wt loss, pleuritic or exertional cp, hempoptysis, orthopnea pnd or change in chronic leg swelling. Also denies presyncope, palpitations, heartburn, abdominal pain, nausea, vomiting, diarrhea or change in bowel or urinary habits, dysuria,hematuria, rash, arthralgias, visual complaints, headache, numbness weakness or ataxia.     Objective:   Physical Exam  Gen. Pleasant, well-nourished, in no distress ENT - no lesions, no post nasal drip Neck: No JVD, no thyromegaly, no carotid bruits Lungs: no use of accessory muscles, no dullness to percussion, clear without rales or rhonchi  Cardiovascular: Rhythm regular, heart sounds  normal, no murmurs or gallops, no  peripheral edema Musculoskeletal: No deformities, no cyanosis or clubbing         Assessment & Plan:

## 2012-07-30 DIAGNOSIS — F329 Major depressive disorder, single episode, unspecified: Secondary | ICD-10-CM | POA: Insufficient documentation

## 2012-07-30 NOTE — Assessment & Plan Note (Signed)
He is beyond grief window & is obviously depressed. He does not want to see a counselor at this time & denies suicidal ideas. I offered him medication but does not want. Will ask PCP to look into this further

## 2012-11-26 ENCOUNTER — Ambulatory Visit: Payer: Medicare Other | Admitting: Adult Health

## 2012-11-29 ENCOUNTER — Ambulatory Visit (INDEPENDENT_AMBULATORY_CARE_PROVIDER_SITE_OTHER): Payer: Medicare Other | Admitting: Adult Health

## 2012-11-29 ENCOUNTER — Encounter: Payer: Self-pay | Admitting: Adult Health

## 2012-11-29 VITALS — BP 144/80 | HR 75 | Temp 97.8°F | Ht 66.0 in | Wt 144.6 lb

## 2012-11-29 DIAGNOSIS — J449 Chronic obstructive pulmonary disease, unspecified: Secondary | ICD-10-CM

## 2012-11-29 MED ORDER — IPRATROPIUM BROMIDE HFA 17 MCG/ACT IN AERS: 2.0000 | INHALATION_SPRAY | Freq: Four times a day (QID) | RESPIRATORY_TRACT | Status: DC

## 2012-11-29 NOTE — Patient Instructions (Addendum)
Continue on current regimen. Follow with Dr. Vassie Loll in 4 months.

## 2012-11-29 NOTE — Addendum Note (Signed)
Addended by: Caryl Ada on: 11/29/2012 02:31 PM   Modules accepted: Orders

## 2012-11-29 NOTE — Assessment & Plan Note (Signed)
Compensated on present regimen Patient to followup in 4 months and as needed.

## 2012-11-29 NOTE — Progress Notes (Signed)
  Subjective:    Patient ID: Jorge [name], male    DOB: 21-May-1941, 72 y.o.   MRN: [id number]  HPI  71/M with CAD for FU of COPD  FEV1 38% in 2008  Adm 3/22 -3/30/ 11 for Right upper lobe pneumonia (methicillin-resistant Staphylococcus aureus).  Readm 4/4 -11/24/09 for rt pneumothorax & persistent air leak -good resolution on FU imaging  Quit smoking since feb '11.  Spiriva trial --like atrovent better   05/28/2012 Acute OV  -CXR ok, doxy , nebs   Last ov was given Symbicort , he did not feel any different. So went back to Flovent  Not seen > 22yr, no ER/Hospital visits.  Feels breathing/dyspnea level/DOE slowly going down hill over last 1 year.  Wife passed away from blood clot 9 months ago- very tough year, children not much support.  He denies any hemoptysis, orthopnea, PND, or leg swelling. No weight loss  There remains very active, working outside in the yard, shopping and cleaning his home   breathing unchanged. cough w/ white-yellow phlem, wheezing and chest tx all the time Mouth stays dry - on regimen of flovent & atrovent  11/29/2012 Follow up  Patient returns for a three-month followup for COPD CAT = 29.   Reports breathing is essentially unchanged  Dyspnea level is at his baseline. Patient continues to work outside in his yard.-light yard work Last visit. Patient was given a trial of Symbicort. He was unable to tell any difference between symbicort  and Flovent. He denies any flare of cough and wheezing, or edema. He uses his albuterol inhaler on average one time weekly. He's had no emergency room or hospitalizations since last visit.    Review of Systems  neg for any significant sore throat, dysphagia, itching, sneezing, nasal congestion or excess/ purulent secretions, fever, chills, sweats, unintended wt loss, pleuritic or exertional cp, hempoptysis, orthopnea pnd or change in chronic leg swelling. Also denies presyncope, palpitations, heartburn, abdominal pain,  nausea, vomiting, diarrhea or change in bowel or urinary habits, dysuria,hematuria, rash, arthralgias, visual complaints, headache, numbness weakness or ataxia.     Objective:   Physical Exam   Gen. Pleasant, well-nourished, in no distress ENT - no lesions, no post nasal drip Neck: No JVD, no thyromegaly, no carotid bruits Lungs: no use of accessory muscles, no dullness to percussion, clear without rales or rhonchi  Cardiovascular: Rhythm regular, heart sounds  normal, no murmurs or gallops, no peripheral edema Musculoskeletal: No deformities, no cyanosis or clubbing         Assessment & Plan:

## 2013-04-25 ENCOUNTER — Encounter: Payer: Self-pay | Admitting: Pulmonary Disease

## 2013-04-25 ENCOUNTER — Ambulatory Visit (INDEPENDENT_AMBULATORY_CARE_PROVIDER_SITE_OTHER)
Admission: RE | Admit: 2013-04-25 | Discharge: 2013-04-25 | Disposition: A | Discharge status: Home / Self Care | Payer: Medicare Other | Source: Ambulatory Visit | Attending: Pulmonary Disease | Admitting: Pulmonary Disease

## 2013-04-25 ENCOUNTER — Ambulatory Visit (INDEPENDENT_AMBULATORY_CARE_PROVIDER_SITE_OTHER): Payer: Medicare Other | Admitting: Pulmonary Disease

## 2013-04-25 VITALS — BP 120/74 | HR 80 | Ht 66.0 in | Wt 139.4 lb

## 2013-04-25 DIAGNOSIS — J449 Chronic obstructive pulmonary disease, unspecified: Secondary | ICD-10-CM

## 2013-04-25 DIAGNOSIS — Z23 Encounter for immunization: Secondary | ICD-10-CM

## 2013-04-25 NOTE — Progress Notes (Signed)
  Subjective:    Patient ID: Jorge [name], male    DOB: 1940/10/29, 72 y.o.   MRN: [id number]  HPI 71/M with CAD for FU of COPD  FEV1 38% in 2008  Adm 3/22 -3/30/ 11 for Right upper lobe pneumonia (methicillin-resistant Staphylococcus aureus).  Readm 4/4 -11/24/09 for rt pneumothorax & persistent air leak -good resolution on FU imaging  Quit smoking since feb '11.  Spiriva trial --like atrovent better  symbicort trial - no better  04/25/2013 56m FU  Pt reports breathing is worse. He thinks it may be more nerves than anything. He has occasional cough w/ clear phlem usually. He does have wheezing and chest tx.  Depressed due to family situation, son has ETOH issues, daughter & grandkid have moved back in with him Started zoloft 3 wks ago, developed acneiform drug rash over all extremities that heals with scab formation  He denies any flare of cough and wheezing, or edema.  He uses his albuterol inhaler on average one time weekly.  He's had no emergency room or hospitalizations since last visit.  Review of Systems  neg for any significant sore throat, dysphagia, itching, sneezing, nasal congestion or excess/ purulent secretions, fever, chills, sweats, unintended wt loss, pleuritic or exertional cp, hempoptysis, orthopnea pnd or change in chronic leg swelling. Also denies presyncope, palpitations, heartburn, abdominal pain, nausea, vomiting, diarrhea or change in bowel or urinary habits, dysuria,hematuria,  arthralgias, visual complaints, headache, numbness weakness or ataxia.     Objective:   Physical Exam  Gen. Pleasant, well-nourished, in no distress ENT - no lesions, no post nasal drip Neck: No JVD, no thyromegaly, no carotid bruits Lungs: no use of accessory muscles, no dullness to percussion, clear without rales or rhonchi  Cardiovascular: Rhythm regular, heart sounds  normal, no murmurs or gallops, no peripheral edema Musculoskeletal: No deformities, no cyanosis or clubbing         Assessment & Plan:

## 2013-04-25 NOTE — Assessment & Plan Note (Signed)
STOP sertraline Flu shot & CXR today Stay on flovent & atrovent Call if worse 

## 2013-04-25 NOTE — Patient Instructions (Signed)
STOP sertraline Flu shot & CXR today Stay on flovent & atrovent Call if worse

## 2013-04-25 NOTE — Addendum Note (Signed)
Addended by: Tommie Sams on: 04/25/2013 04:08 PM   Modules accepted: Orders

## 2013-06-06 ENCOUNTER — Telehealth: Payer: Self-pay | Admitting: Pulmonary Disease

## 2013-06-06 DIAGNOSIS — J449 Chronic obstructive pulmonary disease, unspecified: Secondary | ICD-10-CM

## 2013-06-06 NOTE — Telephone Encounter (Signed)
I spoke with daughter. She reports pt had temp of 102.4 this afternoon and he is having increase SOB-more labored. Pt was also c/o CP. Pt went to UC in [place] and gave him an order for CXR to be done tomorrow and a shot of rocephin 500 mg, shot of decadron 4 mg, shot of kenalog 40 mg. The daughter is wanting to know if he can come here and have a CXR and see someone here instead. I spoke with Florentina Addison and have pt come at 3:30 for CXR prior before seeing CDY at 3:45. Order placed and nothing further needed

## 2013-06-07 ENCOUNTER — Ambulatory Visit (INDEPENDENT_AMBULATORY_CARE_PROVIDER_SITE_OTHER): Payer: Medicare Other | Admitting: Internal Medicine

## 2013-06-07 ENCOUNTER — Ambulatory Visit (INDEPENDENT_AMBULATORY_CARE_PROVIDER_SITE_OTHER)
Admission: RE | Admit: 2013-06-07 | Discharge: 2013-06-07 | Disposition: A | Discharge status: Home / Self Care | Payer: Medicare Other | Source: Ambulatory Visit | Attending: Internal Medicine | Admitting: Internal Medicine

## 2013-06-07 ENCOUNTER — Encounter: Payer: Self-pay | Admitting: Internal Medicine

## 2013-06-07 VITALS — BP 118/74 | HR 90 | Temp 97.4°F | Ht 66.0 in | Wt 136.8 lb

## 2013-06-07 DIAGNOSIS — J441 Chronic obstructive pulmonary disease with (acute) exacerbation: Secondary | ICD-10-CM

## 2013-06-07 DIAGNOSIS — J4489 Other specified chronic obstructive pulmonary disease: Secondary | ICD-10-CM

## 2013-06-07 DIAGNOSIS — J449 Chronic obstructive pulmonary disease, unspecified: Secondary | ICD-10-CM

## 2013-06-07 DIAGNOSIS — J189 Pneumonia, unspecified organism: Secondary | ICD-10-CM

## 2013-06-07 MED ORDER — AMOXICILLIN-POT CLAVULANATE 875-125 MG PO TABS: 1.0000 | ORAL_TABLET | Freq: Two times a day (BID) | ORAL | Status: DC

## 2013-06-07 NOTE — Progress Notes (Signed)
Subjective:    Patient ID: Jorge [name], male    DOB: [birth year], 72 y.o.   MRN: [id number]  HPI Jorge [name] with CAD for FU of COPD  FEV1 38% in 2008  Adm 3/22 -3/30/ 11 for Right upper lobe pneumonia (methicillin-resistant Staphylococcus aureus).  Readm 4/4 -11/24/09 for rt pneumothorax & persistent air leak -good resolution on FU imaging  Quit smoking since feb '11.  Spiriva trial --like atrovent better  symbicort trial - no better  04/25/2013 69m FU  Pt reports breathing is worse. He thinks it may be more nerves than anything. He has occasional cough w/ clear phlem usually. He does have wheezing and chest tx.  Depressed due to family situation, son has ETOH issues, daughter & grandkid have moved back in with him Started zoloft 3 wks ago, developed acneiform drug rash over all extremities that heals with scab formation  He denies any flare of cough and wheezing, or edema.  He uses his albuterol inhaler on average one time weekly.  He's had no emergency room or hospitalizations since last visit.  06/07/13- Acute OV- Dr Maple Hudson- Jorge [name] with CAD, for FU of COPD  Pt c/o fever, chest tightness on left side.  SOB, seldom dry cough, no wheezing. Acute onset illness 2 or 3 days ago Got inj rocephin and inj steroid at [facility] on 10/27. Chest feels tight. Denies cough or sputum. Occipital headache. Chronic pressure/congestion ears and nose CXR 06/06/13 IMPRESSION:  COPD changes with new left upper lobe opacity most likely  representing pneumonia.  Since this is somewhat focal in appearance however, recommend  followup radiographs until resolution to exclude underlying nodule  development.  Electronically Signed  By: Ulyses Southward M.D.  On: 06/07/2013 15:50  ROS-see HPI Constitutional:   No-   weight loss, night sweats, +fevers, no-chills, fatigue, lassitude. HEENT:   +  headaches, difficulty swallowing, tooth/dental problems, sore throat,       No-  sneezing, itching, ear ache, nasal congestion, post  nasal drip,  CV:  No-   chest pain, orthopnea, PND, swelling in lower extremities, anasarca,  dizziness, palpitations Resp: + shortness of breath with exertion or at rest.              No-   productive cough,  No non-productive cough,  No- coughing up of blood.              No-   change in color of mucus.  No- wheezing.   Skin: No-   rash or lesions. GI:  No-   heartburn, indigestion, abdominal pain, nausea, vomiting,  GU:  MS:   Neuro-     nothing unusual Psych:  No- change in mood or affect. No depression or anxiety.  No memory loss.    Objective:  OBJ- Physical Exam General- Alert, Oriented, Affect-appropriate, Distress- none acute Skin- rash-none, lesions- none, excoriation- none Lymphadenopathy- none Head- atraumatic            Eyes- Gross vision intact, PERRLA, conjunctivae and secretions clear            Ears- Hearing, canals-normal            Nose- Clear, no-Septal dev, mucus, polyps, erosion, perforation             Throat- Mallampati II , mucosa clear , drainage- none, tonsils- atrophic Neck- flexible , trachea midline, no stridor , thyroid nl, carotid no bruit Chest - symmetrical excursion , unlabored  Heart/CV- RRR , no murmur , no gallop  , no rub, nl s1 s2                           - JVD- none , edema- none, stasis changes- none, varices- none           Lung- +distant, wheeze- none, cough- none , dullness-none, rub- none           Chest wall-  Abd-  Br/ Gen/ Rectal- Not done, not indicated Extrem- cyanosis- none, clubbing, none, atrophy- none, strength- nl Neuro- grossly intact to observation Assessment & Plan:

## 2013-06-07 NOTE — Patient Instructions (Addendum)
Neb xop 0.63  Script for antibiotic sent to [facility]  Drink plenty of fluids, and you might try Mucinex, about 600 mg twice daily   Make follow-up appointment with either the Nurse Practitioner or Dr Vassie Loll in 3 weeks, with a CXR for dx pneumonia before that visit

## 2013-06-20 ENCOUNTER — Ambulatory Visit
Admission: RE | Admit: 2013-06-20 | Discharge: 2013-06-20 | Disposition: A | Discharge status: Home / Self Care | Payer: Medicare Other | Source: Ambulatory Visit | Attending: Internal Medicine | Admitting: Internal Medicine

## 2013-06-20 DIAGNOSIS — J189 Pneumonia, unspecified organism: Secondary | ICD-10-CM

## 2013-06-20 NOTE — Assessment & Plan Note (Addendum)
Probably initially a viral infection. He got Rocephin and steroid at urgent care. Chest x-ray suggested left upper lobe infiltrate so this may be a pneumonia. Plan-Augmentin, fluids, followup with RA or TP in 3 weeks with chest x-ray, neb today Xopenex

## 2013-06-30 ENCOUNTER — Ambulatory Visit (INDEPENDENT_AMBULATORY_CARE_PROVIDER_SITE_OTHER): Payer: Medicare Other | Admitting: Adult Health

## 2013-06-30 ENCOUNTER — Encounter: Payer: Self-pay | Admitting: Adult Health

## 2013-06-30 ENCOUNTER — Ambulatory Visit (INDEPENDENT_AMBULATORY_CARE_PROVIDER_SITE_OTHER)
Admission: RE | Admit: 2013-06-30 | Discharge: 2013-06-30 | Disposition: A | Discharge status: Home / Self Care | Payer: Medicare Other | Source: Ambulatory Visit | Attending: Internal Medicine | Admitting: Internal Medicine

## 2013-06-30 VITALS — BP 126/72 | HR 96 | Temp 97.1°F | Ht 66.0 in | Wt 137.8 lb

## 2013-06-30 DIAGNOSIS — J441 Chronic obstructive pulmonary disease with (acute) exacerbation: Secondary | ICD-10-CM

## 2013-06-30 DIAGNOSIS — J189 Pneumonia, unspecified organism: Secondary | ICD-10-CM

## 2013-06-30 NOTE — Assessment & Plan Note (Signed)
Recent flare with LUL PNA on cxr  Check cxr today  Clinically improved after abx  Plan  Continue on current regimen  Chest xray today  follow up Dr. Vassie Loll  In 6 weeks and As needed   Please contact office for sooner follow up if symptoms do not improve or worsen or seek emergency care

## 2013-06-30 NOTE — Progress Notes (Signed)
  Subjective:    Patient ID: Jorge [name], male    DOB: 16-May-1941, 72 y.o.   MRN: [id number]  HPI  71/M with CAD for FU of COPD  FEV1 38% in 2008  Adm 3/22 -3/30/ 11 for Right upper lobe pneumonia (methicillin-resistant Staphylococcus aureus).  Readm 4/4 -11/24/09 for rt pneumothorax & persistent air leak -good resolution on FU imaging  Quit smoking since feb '11.  Spiriva trial --like atrovent better  symbicort trial - no better  04/25/13  77m FU Pt reports breathing is worse. He thinks it may be more nerves than anything. He has occasional cough w/ clear phlem usually. He does have wheezing and chest tx.  Depressed due to family situation, son has ETOH issues, daughter & grandkid have moved back in with him Started zoloft 3 wks ago, developed acneiform drug rash over all extremities that heals with scab formation  He denies any flare of cough and wheezing, or edema.  He uses his albuterol inhaler on average one time weekly.  He's had no emergency room or hospitalizations since last visit. >>zoloft stopped. , same rx  06/30/2013 Follow up PNA  Seen 3 weekss ago with Dr. Maple Hudson  With cough/fever .  Found to have PNA LUL infiltrate .  Tx with 10 days of Augmentin.  Says he is some better. Fever and cough are improved.  Still has dyspnea. No change.  No fever, chest pain  Or orthopnea.  Flovent and atrovent rx cost are rising.  Discussed changing to nebs -wants to wait until after first of year maybe.   Review of Systems   neg for any significant sore throat, dysphagia, itching, sneezing, nasal congestion or excess/ purulent secretions, fever, chills, sweats, unintended wt loss, pleuritic or exertional cp, hempoptysis, orthopnea pnd or change in chronic leg swelling. Also denies presyncope, palpitations, heartburn, abdominal pain, nausea, vomiting, diarrhea or change in bowel or urinary habits, dysuria,hematuria,  arthralgias, visual complaints, headache, numbness weakness or  ataxia.     Objective:   Physical Exam   Gen. Pleasant, well-nourished, in no distress ENT - no lesions, no post nasal drip Neck: No JVD, no thyromegaly, no carotid bruits Lungs: no use of accessory muscles, no dullness to percussion, clear without rales or rhonchi  Cardiovascular: Rhythm regular, heart sounds  normal, no murmurs or gallops, no peripheral edema Musculoskeletal: No deformities, no cyanosis or clubbing        Assessment & Plan:

## 2013-06-30 NOTE — Patient Instructions (Signed)
Continue on current regimen  Chest xray today  follow up Dr. Vassie Loll  In 6 weeks and As needed   Please contact office for sooner follow up if symptoms do not improve or worsen or seek emergency care

## 2013-07-15 ENCOUNTER — Telehealth: Payer: Self-pay | Admitting: Internal Medicine

## 2013-07-15 NOTE — Progress Notes (Signed)
Quick Note:  Pt aware of results. ______ 

## 2013-07-15 NOTE — Telephone Encounter (Signed)
Notes Recorded by Waymon Budge, MD on 06/30/2013 at 5:12 PM CXR improved, but there is still residual of pneumonia in left upper lobe which should be followed. Keep appointment as scheduled.   Pt aware of results and to keep appointment.

## 2013-08-23 ENCOUNTER — Ambulatory Visit (INDEPENDENT_AMBULATORY_CARE_PROVIDER_SITE_OTHER)
Admission: RE | Admit: 2013-08-23 | Discharge: 2013-08-23 | Disposition: A | Discharge status: Home / Self Care | Payer: Medicare Other | Source: Ambulatory Visit | Attending: Pulmonary Disease | Admitting: Pulmonary Disease

## 2013-08-23 ENCOUNTER — Encounter: Payer: Self-pay | Admitting: Pulmonary Disease

## 2013-08-23 ENCOUNTER — Ambulatory Visit (INDEPENDENT_AMBULATORY_CARE_PROVIDER_SITE_OTHER): Payer: Medicare Other | Admitting: Pulmonary Disease

## 2013-08-23 VITALS — BP 122/76 | HR 92 | Temp 97.7°F | Ht 66.0 in | Wt 139.8 lb

## 2013-08-23 DIAGNOSIS — Z23 Encounter for immunization: Secondary | ICD-10-CM

## 2013-08-23 DIAGNOSIS — J189 Pneumonia, unspecified organism: Secondary | ICD-10-CM

## 2013-08-23 DIAGNOSIS — J441 Chronic obstructive pulmonary disease with (acute) exacerbation: Secondary | ICD-10-CM

## 2013-08-23 NOTE — Assessment & Plan Note (Signed)
CXr today - fu LUL infx to resolution Prevnar shot

## 2013-08-23 NOTE — Patient Instructions (Addendum)
CXr today Prevnar shot

## 2013-08-23 NOTE — Assessment & Plan Note (Signed)
He prefers his current regimen of flovent + atrovent with albuterol nebs for breakthrough Alternatives have been discussed

## 2013-08-23 NOTE — Progress Notes (Signed)
   Subjective:    Patient ID: Jorge [name], male    DOB: March 13, 1941, 73 y.o.   MRN: [id number]  HPI  72/M with CAD for FU of COPD  FEV1 38% in 2008  Adm 10/2009 for Right upper lobe MRSA pneumonia complicated by rt pneumothorax & persistent air leak Quit smoking since feb '11.  Spiriva trial --like atrovent better  symbicort trial - no better    08/23/2013  Chief Complaint  Patient presents with  . COPD    Increased sob and cough with thick white mucus x 1-2 months   06/2013  LUL infiltrate -Tx with 10 days of Augmentin.  Says he is some better. Fever and cough are improved. Dyspnea at baseline CXR 07/15/13 LUL plate like density improved from 10/14 Depressed due to family situation, son has ETOH/ addiction issues, daughter & son live with him  Flovent and atrovent rx cost high when in [place], otherwise ok.  Discussed changing to nebs.   Review of Systems neg for any significant sore throat, dysphagia, itching, sneezing, nasal congestion or excess/ purulent secretions, fever, chills, sweats, unintended wt loss, pleuritic or exertional cp, hempoptysis, orthopnea pnd or change in chronic leg swelling. Also denies presyncope, palpitations, heartburn, abdominal pain, nausea, vomiting, diarrhea or change in bowel or urinary habits, dysuria,hematuria, rash, arthralgias, visual complaints, headache, numbness weakness or ataxia.     Objective:   Physical Exam  Gen. Pleasant, well-nourished, in no distress ENT - no lesions, no post nasal drip Neck: No JVD, no thyromegaly, no carotid bruits Lungs: no use of accessory muscles, no dullness to percussion, decreased  without rales or rhonchi  Cardiovascular: Rhythm regular, heart sounds  normal, no murmurs or gallops, no peripheral edema Musculoskeletal: No deformities, no cyanosis or clubbing        Assessment & Plan:

## 2013-08-24 ENCOUNTER — Other Ambulatory Visit: Payer: Self-pay | Admitting: Pulmonary Disease

## 2013-08-24 DIAGNOSIS — J189 Pneumonia, unspecified organism: Secondary | ICD-10-CM

## 2013-08-26 ENCOUNTER — Other Ambulatory Visit (INDEPENDENT_AMBULATORY_CARE_PROVIDER_SITE_OTHER): Payer: Medicare Other

## 2013-08-26 DIAGNOSIS — J189 Pneumonia, unspecified organism: Secondary | ICD-10-CM

## 2013-08-26 LAB — BASIC METABOLIC PANEL
BUN: 9 mg/dL (ref 6–23)
CO2: 31 meq/L (ref 19–32)
Calcium: 9.3 mg/dL (ref 8.4–10.5)
Chloride: 103 mEq/L (ref 96–112)
Creatinine, Ser: 1 mg/dL (ref 0.4–1.5)
GFR: 77.61 mL/min (ref >60.00)
GLUCOSE: 95 mg/dL (ref 70–99)
Potassium: 4.1 mEq/L (ref 3.5–5.1)
SODIUM: 140 meq/L (ref 135–145)

## 2013-08-31 ENCOUNTER — Telehealth: Payer: Self-pay | Admitting: *Deleted

## 2013-08-31 ENCOUNTER — Ambulatory Visit (INDEPENDENT_AMBULATORY_CARE_PROVIDER_SITE_OTHER)
Admission: RE | Admit: 2013-08-31 | Discharge: 2013-08-31 | Disposition: A | Discharge status: Home / Self Care | Payer: Medicare Other | Source: Ambulatory Visit | Attending: Pulmonary Disease | Admitting: Pulmonary Disease

## 2013-08-31 DIAGNOSIS — J189 Pneumonia, unspecified organism: Secondary | ICD-10-CM

## 2013-08-31 MED ORDER — IOHEXOL 300 MG/ML  SOLN
80.0000 mL | Freq: Once | INTRAMUSCULAR | Status: AC | PRN
  Administered 2013-08-31: 80 mL via INTRAVENOUS

## 2013-08-31 NOTE — Telephone Encounter (Signed)
Rose called. Pt had CT done today. She called to let us know pt had slight contrast go under his skin. Placed ice on it. FYI for [place] incase pt calls. Will forward to RA as an [place]

## 2013-09-01 NOTE — Telephone Encounter (Signed)
I spoke with patient about results and he verbalized understanding and had no questions 

## 2013-09-01 NOTE — Telephone Encounter (Signed)
Favor Scarring from earlier pna Advise rpt scan in 

## 2013-09-12 ENCOUNTER — Ambulatory Visit: Payer: Medicare Other | Admitting: Cardiology

## 2013-09-21 ENCOUNTER — Telehealth: Payer: Self-pay | Admitting: Pulmonary Disease

## 2013-09-21 NOTE — Telephone Encounter (Signed)
I have filled out form and faxed back for PA on atrovent. Awaiting response on this request

## 2013-10-20 ENCOUNTER — Ambulatory Visit: Payer: Medicare Other | Admitting: Cardiology

## 2013-10-24 ENCOUNTER — Ambulatory Visit: Payer: Medicare Other | Admitting: Adult Health

## 2013-11-15 ENCOUNTER — Ambulatory Visit (INDEPENDENT_AMBULATORY_CARE_PROVIDER_SITE_OTHER): Payer: Medicare Other | Admitting: Cardiology

## 2013-11-15 ENCOUNTER — Encounter: Payer: Self-pay | Admitting: General Surgery

## 2013-11-15 ENCOUNTER — Encounter: Payer: Self-pay | Admitting: Cardiology

## 2013-11-15 VITALS — BP 118/70 | HR 100 | Ht 66.0 in | Wt 133.0 lb

## 2013-11-15 DIAGNOSIS — I4949 Other premature depolarization: Secondary | ICD-10-CM

## 2013-11-15 DIAGNOSIS — I493 Ventricular premature depolarization: Secondary | ICD-10-CM | POA: Insufficient documentation

## 2013-11-15 DIAGNOSIS — I2510 Atherosclerotic heart disease of native coronary artery without angina pectoris: Secondary | ICD-10-CM

## 2013-11-15 DIAGNOSIS — E7800 Pure hypercholesterolemia, unspecified: Secondary | ICD-10-CM

## 2013-11-15 DIAGNOSIS — R0602 Shortness of breath: Secondary | ICD-10-CM

## 2013-11-15 NOTE — Addendum Note (Signed)
Addended by: Nita Sells on: 11/15/2013 09:44 AM   Modules accepted: Orders

## 2013-11-15 NOTE — Progress Notes (Signed)
[address] [place], [place]  [address] Phone: [phone] Fax:  [phone]  Date:  11/15/2013   ID:  Jorge [name], DOB 04-Aug-1941, MRN [id number]  PCP:  Jorge Lance, MD  Cardiologist:  Jorge Magic, MD    History of Present Illness: Jorge [name] is a 73 y.o. male with a history of ASCAD with PCI of the LAD/D1 and cutting balloon to the left circ marginal in 2003, dyslipidemia and PVC's who presents today for followup.  He is doing well.  He denies any chest pain,  LE edema, dizziness, palpitations or syncope.  He has chronic DOE which he thinks has gotten worse recently.     Wt Readings from Last 3 Encounters:  11/15/13 133 lb (60.328 kg)  08/23/13 139 lb 12.8 oz (63.413 kg)  06/30/13 137 lb 12.8 oz (62.506 kg)     Past Medical History  Diagnosis Date  . Hypertension   . COPD (chronic obstructive pulmonary disease)   . CAD (coronary artery disease) 2003    s/p stent placement ( stent to LAD/D1 and cutting balloon to LCirc marginal)  . BPH (benign prostatic hypertrophy)   . Colon polyp   . Hypercholesteremia   . PVC's (premature ventricular contractions)   . MRSA (methicillin resistant staphylococcus aureus) pneumonia 10/2009    Current Outpatient Prescriptions  Medication Sig Dispense Refill  . albuterol (PROVENTIL) (2.5 MG/3ML) 0.083% nebulizer solution Take 3 mLs (2.5 mg total) by nebulization every 6 (six) hours as needed.  300 mL  5  . aspirin 81 MG tablet Take 81 mg by mouth daily.        Marland Kitchen buPROPion (WELLBUTRIN XL) 150 MG 24 hr tablet Take 1 tablet by mouth daily.      . fluticasone (FLOVENT HFA) 44 MCG/ACT inhaler Inhale 2 puffs into the lungs 2 (two) times daily.        Marland Kitchen ipratropium (ATROVENT HFA) 17 MCG/ACT inhaler Inhale 2 puffs into the lungs. 2-4 times daily PRN      . simvastatin (ZOCOR) 40 MG tablet Take 80 mg by mouth every evening.        No current facility-administered medications for this visit.    Allergies:    Allergies    Allergen Reactions  . Adenosine     Bronchospasms   . Codeine     REACTION: nervousness, ornery  . Erythromycin     Upset stomach  . Sudafed [Pseudoephedrine]     Urinary Sx  . Sulfa Antibiotics     Social History:  The patient  reports that he quit smoking about 4 years ago. His smoking use included Cigarettes. He has a 50 pack-year smoking history. He does not have any smokeless tobacco history on file. He reports that he does not drink alcohol or use illicit drugs.   Family History:  The patient's family history includes Asthma in his father; Breast cancer in his mother; CAD in his father; Lung cancer in his mother.   ROS:  Please see the history of present illness.      All other systems reviewed and negative.   PHYSICAL EXAM: VS:  BP 118/70  Pulse 100  Ht 5\' 6"  (1.676 m)  Wt 133 lb (60.328 kg)  BMI 21.48 kg/m2 Well nourished, well developed, in no acute distress HEENT: normal Neck: no JVD Cardiac:  normal S1, S2; RRR; no murmur Lungs:  clear to auscultation bilaterally, no wheezing, rhonchi or rales Abd: soft, nontender, no hepatomegaly Ext:  no edema, 1+ Post tibial pulses Skin: warm and dry Neuro:  CNs 2-12 intact, no focal abnormalities noted      ASSESSMENT AND PLAN:  1. ASCAD with no angina CP but he thinks that his SOB has worsened recently.  This is most likely due to COPD but need to rule out coronary ischemia.   - continue ASA - Lexiscan Myoveiw to rule out ischemia 2. Dyslipidemia - LDL at goal at 72 from lipids 03/2013 - continue Smivastatin 3. Asymptomatic PVC's  Followup with me in 1 year  Signed, Jorge Magicraci Duayne Brideau, MD 11/15/2013 9:32 AM

## 2013-11-15 NOTE — Patient Instructions (Signed)
Your physician recommends that you continue on your current medications as directed. Please refer to the Current Medication list given to you today.  Your physician has requested that you have a lexiscan myoview. For further information please visit [email or web address]. Please follow instruction sheet, as given.  Your physician wants you to follow-up in: 1 year with Dr. Turner.  You will receive a reminder letter in the mail two months in advance. If you don't receive a letter, please call our office to schedule the follow-up appointment.  

## 2013-11-22 ENCOUNTER — Ambulatory Visit (HOSPITAL_COMMUNITY): Payer: Medicare Other | Attending: Cardiovascular Disease | Admitting: Radiology

## 2013-11-22 DIAGNOSIS — R0989 Other specified symptoms and signs involving the circulatory and respiratory systems: Secondary | ICD-10-CM

## 2013-11-22 DIAGNOSIS — R0602 Shortness of breath: Secondary | ICD-10-CM

## 2013-11-22 MED ORDER — TECHNETIUM TC 99M SESTAMIBI GENERIC - CARDIOLITE
33.0000 | Freq: Once | INTRAVENOUS | Status: AC | PRN
  Administered 2013-11-22: 33 via INTRAVENOUS

## 2013-11-24 ENCOUNTER — Ambulatory Visit (HOSPITAL_COMMUNITY): Payer: Medicare Other | Attending: Cardiology | Admitting: Radiology

## 2013-11-24 ENCOUNTER — Encounter: Payer: Self-pay | Admitting: Cardiology

## 2013-11-24 VITALS — BP 128/74 | Ht 66.0 in | Wt 130.0 lb

## 2013-11-24 DIAGNOSIS — R002 Palpitations: Secondary | ICD-10-CM | POA: Insufficient documentation

## 2013-11-24 DIAGNOSIS — I2510 Atherosclerotic heart disease of native coronary artery without angina pectoris: Secondary | ICD-10-CM

## 2013-11-24 DIAGNOSIS — R0989 Other specified symptoms and signs involving the circulatory and respiratory systems: Principal | ICD-10-CM | POA: Insufficient documentation

## 2013-11-24 DIAGNOSIS — R0609 Other forms of dyspnea: Secondary | ICD-10-CM | POA: Insufficient documentation

## 2013-11-24 DIAGNOSIS — R0602 Shortness of breath: Secondary | ICD-10-CM

## 2013-11-24 MED ORDER — REGADENOSON 0.4 MG/5ML IV SOLN
0.4000 mg | Freq: Once | INTRAVENOUS | Status: AC
  Administered 2013-11-24: 0.4 mg via INTRAVENOUS

## 2013-11-24 MED ORDER — TECHNETIUM TC 99M SESTAMIBI GENERIC - CARDIOLITE
30.0000 | Freq: Once | INTRAVENOUS | Status: AC | PRN
  Administered 2013-11-24: 30 via INTRAVENOUS

## 2013-11-24 NOTE — Progress Notes (Signed)
[facility] SITE 3 NUCLEAR MED [address] [place], [place] [address] [phone]    Cardiology Nuclear Med Study  Jorge [name] is a 73 y.o. male     MRN : [id number]     DOB: 09/17/1940  Procedure Date: 11/24/2013  Nuclear Med Background Indication for Stress Test:  Evaluation for Ischemia and PTCA Patency History:  COPD and 2011 ECHO: EF: 65% 2013 MPI: NL EF: 50% PTCA LAD D1 LCFX Cardiac Risk Factors: Family History - CAD, History of Smoking and Lipids  Symptoms:  DOE, Palpitations and SOB   Nuclear Pre-Procedure Caffeine/Decaff Intake:  None NPO After: 8:00am   Lungs:  clear O2 Sat: 95% on room air. IV 0.9% NS with Angio Cath:  22g  IV Site: R Antecubital  IV Started by:  Milana Na, EMT-P  Chest Size (in):  36 Cup Size: n/a  Height: 5\' 6"  (1.676 m)  Weight:  130 lb (58.968 kg)  BMI:  Body mass index is 20.99 kg/(m^2). Tech Comments:  No Rx this am    Nuclear Med Study 1 or 2 day study: 2 day  Stress Test Type:  Eugenie Birks  Reading MD: n/a  Order Authorizing Provider:  T.Turner MD  Resting Radionuclide: Technetium 1m Sestamibi  Resting Radionuclide Dose: 33.0 mCi  11/23/13  Stress Radionuclide:  Technetium 33m Sestamibi  Stress Radionuclide Dose: 33.0 mCi 11/24/13          Stress Protocol Rest HR: 85 Stress HR: 85  Rest BP: 128/74 Stress BP: 120/87  Exercise Time (min): n/a METS: n/a           Dose of Adenosine (mg):  n/a Dose of Lexiscan: 0.4 mg  Dose of Atropine (mg): n/a Dose of Dobutamine: n/a mcg/kg/min (at max HR)  Stress Test Technologist: Milana Na, EMT-P  Nuclear Technologist:  Doyne Keel, CNMT     Rest Procedure:  Myocardial perfusion imaging was performed at rest 45 minutes following the intravenous administration of Technetium 55m Sestamibi. Rest ECG: NSR - Normal EKG  Stress Procedure:  The patient received IV Lexiscan 0.4 mg over 15-seconds.  Technetium 35m Sestamibi injected at 30-seconds. This patient had sob, a  headache, and sob with the Lexiscan injection. Quantitative spect images were obtained after a 45 minute delay. Stress ECG: No significant change from baseline ECG  QPS Raw Data Images:  Normal; no motion artifact; normal heart/lung ratio. Stress Images:  Normal homogeneous uptake in all areas of the myocardium. Rest Images:  Normal homogeneous uptake in all areas of the myocardium. Subtraction (SDS):  No evidence of ischemia. Transient Ischemic Dilatation (Normal <1.22):  1.02 Lung/Heart Ratio (Normal <0.45):  0.31  Quantitative Gated Spect Images QGS EDV:  94 ml QGS ESV:  56 ml  Impression Exercise Capacity:  Lexiscan with no exercise. BP Response:  Normal blood pressure response. Clinical Symptoms:  Mild chest pain/dyspnea. ECG Impression:  No significant ST segment change suggestive of ischemia. Comparison with Prior Nuclear Study: No images to compare  Overall Impression:  Low risk stress nuclear study.  No ischemia. LV ejection fraction calculates at 40%. Previous EF 50% in 2013. Visually the EF appears better than 40%. Consider 2D echo to validate EF.  LV Ejection Fraction: 40%.  LV Wall Motion:  No segmental wall motion abnormalities.  Cassell Clement  MD

## 2013-11-25 ENCOUNTER — Other Ambulatory Visit (HOSPITAL_COMMUNITY): Payer: Self-pay | Admitting: Radiology

## 2013-11-25 DIAGNOSIS — R0602 Shortness of breath: Secondary | ICD-10-CM

## 2013-11-25 DIAGNOSIS — I2510 Atherosclerotic heart disease of native coronary artery without angina pectoris: Secondary | ICD-10-CM

## 2013-11-25 NOTE — Addendum Note (Signed)
Addended by: Milinda Antis on: 11/25/2013 10:08 AM   Modules accepted: Orders

## 2013-11-28 ENCOUNTER — Other Ambulatory Visit: Payer: Self-pay | Admitting: *Deleted

## 2013-11-28 DIAGNOSIS — I501 Left ventricular failure: Secondary | ICD-10-CM

## 2013-12-09 ENCOUNTER — Ambulatory Visit (HOSPITAL_COMMUNITY): Payer: Medicare Other | Attending: Internal Medicine | Admitting: Radiology

## 2013-12-09 DIAGNOSIS — R9430 Abnormal result of cardiovascular function study, unspecified: Secondary | ICD-10-CM | POA: Insufficient documentation

## 2013-12-09 DIAGNOSIS — J449 Chronic obstructive pulmonary disease, unspecified: Secondary | ICD-10-CM | POA: Insufficient documentation

## 2013-12-09 DIAGNOSIS — I2510 Atherosclerotic heart disease of native coronary artery without angina pectoris: Secondary | ICD-10-CM | POA: Insufficient documentation

## 2013-12-09 DIAGNOSIS — I501 Left ventricular failure: Secondary | ICD-10-CM

## 2013-12-09 DIAGNOSIS — I10 Essential (primary) hypertension: Secondary | ICD-10-CM | POA: Insufficient documentation

## 2013-12-09 DIAGNOSIS — E785 Hyperlipidemia, unspecified: Secondary | ICD-10-CM | POA: Insufficient documentation

## 2013-12-09 DIAGNOSIS — J4489 Other specified chronic obstructive pulmonary disease: Secondary | ICD-10-CM | POA: Insufficient documentation

## 2013-12-09 DIAGNOSIS — R0602 Shortness of breath: Secondary | ICD-10-CM | POA: Insufficient documentation

## 2013-12-09 DIAGNOSIS — I493 Ventricular premature depolarization: Secondary | ICD-10-CM

## 2013-12-09 NOTE — Progress Notes (Signed)
Echocardiogram performed.  

## 2013-12-13 ENCOUNTER — Other Ambulatory Visit: Payer: Self-pay | Admitting: General Surgery

## 2013-12-13 ENCOUNTER — Telehealth: Payer: Self-pay | Admitting: Cardiology

## 2013-12-13 DIAGNOSIS — R0602 Shortness of breath: Secondary | ICD-10-CM

## 2013-12-13 NOTE — Telephone Encounter (Signed)
New Message:  Pt is requesting a call back from [place]. States he is returning a call to her.Marland KitchenMarland Kitchen

## 2013-12-13 NOTE — Telephone Encounter (Signed)
Spoke with Pt and gave ECHO results.

## 2013-12-22 ENCOUNTER — Other Ambulatory Visit (INDEPENDENT_AMBULATORY_CARE_PROVIDER_SITE_OTHER): Payer: Medicare Other

## 2013-12-22 ENCOUNTER — Encounter: Payer: Self-pay | Admitting: Adult Health

## 2013-12-22 ENCOUNTER — Ambulatory Visit (INDEPENDENT_AMBULATORY_CARE_PROVIDER_SITE_OTHER): Payer: Medicare Other | Admitting: Adult Health

## 2013-12-22 VITALS — BP 118/70 | HR 69 | Temp 97.7°F | Ht 66.0 in | Wt 135.6 lb

## 2013-12-22 DIAGNOSIS — J984 Other disorders of lung: Secondary | ICD-10-CM

## 2013-12-22 DIAGNOSIS — J449 Chronic obstructive pulmonary disease, unspecified: Secondary | ICD-10-CM

## 2013-12-22 DIAGNOSIS — R0602 Shortness of breath: Secondary | ICD-10-CM

## 2013-12-22 DIAGNOSIS — R918 Other nonspecific abnormal finding of lung field: Secondary | ICD-10-CM

## 2013-12-22 LAB — BRAIN NATRIURETIC PEPTIDE: Pro B Natriuretic peptide (BNP): 17 pg/mL (ref 0.0–100.0)

## 2013-12-22 NOTE — Patient Instructions (Signed)
Continue on current regimen  We will set up CT in 4-6 weeks  Follow up Dr. Vassie Loll  In 6 weeks follow up and to discuss CT results.  Please contact office for sooner follow up if symptoms do not improve or worsen or seek emergency care

## 2013-12-22 NOTE — Assessment & Plan Note (Signed)
CT chest 08/2013 shows Right greater than left  apical pleural thickening and scarring noted with extensive  emphysematous and bullous change. An area of focal pleural  thickening and probable adjacent pulmonary parenchymal scarring in  the left upper lobe >will follow area serial by CT  Set up CT in June w/ follow up Ov

## 2013-12-22 NOTE — Progress Notes (Signed)
   Subjective:    Patient ID: Jorge [name], male    DOB: [birth year], 73 y.o.   MRN: [id number]  HPI 72/M with CAD for FU of COPD  FEV1 38% in 2008  Adm 10/2009 for Right upper lobe MRSA pneumonia complicated by rt pneumothorax & persistent air leak Quit smoking since feb '11.  Spiriva trial --like atrovent better  symbicort trial - no better   08/23/13  06/2013  LUL infiltrate -Tx with 10 days of Augmentin.  Says he is some better. Fever and cough are improved. Dyspnea at baseline CXR 07/15/13 LUL plate like density improved from 10/14 Depressed due to family situation, son has ETOH/ addiction issues, daughter & son live with him  Flovent and atrovent rx cost high when in [place], otherwise ok.  Discussed changing to nebs.   12/22/2013 Follow up COPD  Returns for follow up .  Says his breathing is about the same, gets winded easily .  No flare in cough, or wheezing.  No ER /Hospital admits.  Remains on Flovent 2 puffs Twice daily  And Atrovent Twice daily   CT chest 08/2013 showed An area of focal pleural  thickening and probable adjacent pulmonary parenchymal scarring in the left upper lobe , extensive  Needs repeat next month to follow this area.  He denies any hemoptysis, chest pain, orthopnea, PND, or leg swelling    Review of Systems  neg for any significant sore throat, dysphagia, itching, sneezing, nasal congestion or excess/ purulent secretions, fever, chills, sweats, unintended wt loss, pleuritic or exertional cp, hempoptysis, orthopnea pnd or change in chronic leg swelling. Also denies presyncope, palpitations, heartburn, abdominal pain, nausea, vomiting, diarrhea or change in bowel or urinary habits, dysuria,hematuria, rash, arthralgias, visual complaints, headache, numbness weakness or ataxia.     Objective:   Physical Exam   Gen. Pleasant, elderly thin in no distress ENT - no lesions, no post nasal drip Neck: No JVD, no thyromegaly, no carotid  bruits Lungs: no use of accessory muscles, no dullness to percussion, decreased  without rales or rhonchi  Cardiovascular: Rhythm regular, heart sounds  normal, no murmurs or gallops, no peripheral edema Musculoskeletal: No deformities, no cyanosis or clubbing        Assessment & Plan:

## 2013-12-22 NOTE — Assessment & Plan Note (Signed)
Severe COPD w/ bullous emphysema  Cont on current regimen  follow up in 6 weeks and As needed

## 2013-12-23 ENCOUNTER — Telehealth: Payer: Self-pay | Admitting: Cardiology

## 2013-12-23 NOTE — Telephone Encounter (Signed)
New message ° ° ° °Returned Danielle's call °

## 2013-12-23 NOTE — Telephone Encounter (Signed)
Made pt aware of lab results.

## 2014-01-18 ENCOUNTER — Ambulatory Visit (INDEPENDENT_AMBULATORY_CARE_PROVIDER_SITE_OTHER)
Admission: RE | Admit: 2014-01-18 | Discharge: 2014-01-18 | Disposition: A | Discharge status: Home / Self Care | Payer: Medicare Other | Source: Ambulatory Visit | Attending: Pulmonary Disease | Admitting: Pulmonary Disease

## 2014-01-18 DIAGNOSIS — J984 Other disorders of lung: Secondary | ICD-10-CM

## 2014-01-19 ENCOUNTER — Inpatient Hospital Stay: Admission: RE | Admit: 2014-01-19 | Payer: Medicare Other | Source: Ambulatory Visit

## 2014-02-08 ENCOUNTER — Ambulatory Visit (INDEPENDENT_AMBULATORY_CARE_PROVIDER_SITE_OTHER): Payer: Medicare Other | Admitting: Pulmonary Disease

## 2014-02-08 ENCOUNTER — Encounter: Payer: Self-pay | Admitting: Pulmonary Disease

## 2014-02-08 VITALS — BP 122/76 | HR 74 | Temp 98.1°F | Ht 66.0 in | Wt 139.4 lb

## 2014-02-08 DIAGNOSIS — J441 Chronic obstructive pulmonary disease with (acute) exacerbation: Secondary | ICD-10-CM

## 2014-02-08 DIAGNOSIS — R918 Other nonspecific abnormal finding of lung field: Secondary | ICD-10-CM

## 2014-02-08 NOTE — Patient Instructions (Signed)
Trial of spiriva INSTEAD of atrovent - call me for Rx if this works -check out cost Stay on flovent CT scan in 1 year

## 2014-02-08 NOTE — Assessment & Plan Note (Signed)
CT scan in 1 year to FU LUL opacity

## 2014-02-08 NOTE — Assessment & Plan Note (Signed)
Trial of spiriva INSTEAD of atrovent - call me for Rx if this works -check out cost Stay on flovent

## 2014-02-08 NOTE — Progress Notes (Signed)
   Subjective:    Patient ID: Jorge [name], male    DOB: 04-02-1941, 73 y.o.   MRN: [id number]  HPI  72/M with CAD for FU of COPD  FEV1 38% in 2008  Adm 10/2009 for Right upper lobe MRSA pneumonia complicated by rt pneumothorax & persistent air leak  Quit smoking since feb '11.  Spiriva trial --like atrovent better  symbicort trial - no better    02/08/2014  Chief Complaint  Patient presents with  . Follow-up    Pt reports breathing unchanged. C/o lots of wheezing, prod cough clear-green phlem at times   Says his breathing is slight worse, gets winded easily .  No flare in cough, or wheezing.  No ER /Hospital admits.  Remains on Flovent 2 puffs Twice daily And Atrovent Twice daily  CT chest 08/2013 showed An area of focal pleural  thickening and probable adjacent pulmonary parenchymal scarring in the left upper lobe , extensive   CT chest 01/19/14 - stable Chronic post infectious changes  throughout the lungs bilaterally, LUL ground glass opacity  Depressed due to family situation, son has ETOH/ addiction issues, daughter  Is in prison    Review of Systems neg for any significant sore throat, dysphagia, itching, sneezing, nasal congestion or excess/ purulent secretions, fever, chills, sweats, unintended wt loss, pleuritic or exertional cp, hempoptysis, orthopnea pnd or change in chronic leg swelling. Also denies presyncope, palpitations, heartburn, abdominal pain, nausea, vomiting, diarrhea or change in bowel or urinary habits, dysuria,hematuria, rash, arthralgias, visual complaints, headache, numbness weakness or ataxia.     Objective:   Physical Exam  Gen. Pleasant, well-nourished, in no distress ENT - no lesions, no post nasal drip Neck: No JVD, no thyromegaly, no carotid bruits Lungs: no use of accessory muscles, no dullness to percussion, decreased without rales or rhonchi  Cardiovascular: Rhythm regular, heart sounds  normal, no murmurs or gallops, no peripheral  edema Musculoskeletal: No deformities, no cyanosis or clubbing         Assessment & Plan:

## 2014-02-09 ENCOUNTER — Telehealth: Payer: Self-pay | Admitting: Pulmonary Disease

## 2014-02-09 NOTE — Telephone Encounter (Signed)
2 inhalations daily

## 2014-02-09 NOTE — Telephone Encounter (Signed)
Pt calling-forgot how he is supposed to use Spiriva samples given at last OV I do not see anything documented in OV notes for dosing-- pt states this inhaler has to be twisted to activate-- states its Spiriva Respimat.  Please advise Dr Vassie Loll on instructions for use. Thanks!

## 2014-02-09 NOTE — Telephone Encounter (Signed)
lmomtcb x1 

## 2014-02-13 NOTE — Telephone Encounter (Signed)
Called, spoke with pt.  Informed him of below per RA.  He verbalized understanding and voiced no further questions or concerns at this time.

## 2014-06-12 ENCOUNTER — Encounter: Payer: Self-pay | Admitting: Adult Health

## 2014-06-12 ENCOUNTER — Ambulatory Visit (INDEPENDENT_AMBULATORY_CARE_PROVIDER_SITE_OTHER): Payer: Medicare Other | Admitting: Adult Health

## 2014-06-12 VITALS — BP 118/76 | HR 85 | Temp 97.0°F | Ht 66.0 in | Wt 146.8 lb

## 2014-06-12 DIAGNOSIS — R918 Other nonspecific abnormal finding of lung field: Secondary | ICD-10-CM

## 2014-06-12 DIAGNOSIS — J449 Chronic obstructive pulmonary disease, unspecified: Secondary | ICD-10-CM

## 2014-06-12 NOTE — Progress Notes (Signed)
Reviewed & agree with plan  

## 2014-06-12 NOTE — Assessment & Plan Note (Signed)
Compensated on present regimen   Plan  Continue on current regimen  Follow up Dr. Vassie Loll  In 4 months and As needed

## 2014-06-12 NOTE — Patient Instructions (Signed)
Continue on current regimen .  Follow up Dr. Alva  In 4 months and As needed     

## 2014-06-12 NOTE — Assessment & Plan Note (Signed)
Repeat in 01/2015

## 2014-06-12 NOTE — Progress Notes (Signed)
   Subjective:    Patient ID: Jorge [name], male    DOB: 1940-11-26, 73 y.o.   MRN: [id number]  HPI   72/M with CAD for FU of COPD  FEV1 38% in 2008  Adm 10/2009 for Right upper lobe MRSA pneumonia complicated by rt pneumothorax & persistent air leak  Quit smoking since feb '11.  Spiriva trial --like atrovent better  symbicort trial - no better   02/08/14  Says his breathing is slight worse, gets winded easily .  No flare in cough, or wheezing.  No ER /Hospital admits.  Remains on Flovent 2 puffs Twice daily And Atrovent Twice daily  CT chest 08/2013 showed An area of focal pleural  thickening and probable adjacent pulmonary parenchymal scarring in the left upper lobe , extensive   CT chest 01/19/14 - stable Chronic post infectious changes  throughout the lungs bilaterally, LUL ground glass opacity  Depressed due to family situation, son has ETOH/ addiction issues, daughter  Is in prison   06/12/2014 Follow up COPD  Returns for a  4 month follow up .  Reports breathing is doing well overall.  Does report some increased SOB and wheezing x2 days with the weather.   No fever or chest pain.  Denies any cough, purulent sputum, f/c/s, n/v/d Flu shot up to date.  No ER or hospital visits.  Remains on Atrovent 2 puffs Twice daily  And Flovent 2 puffs Twice daily   Trial of Spirva last ov , did not feel this worked for him.   Review of Systems  neg for any significant sore throat, dysphagia, itching, sneezing, nasal congestion or excess/ purulent secretions, fever, chills, sweats, unintended wt loss, pleuritic or exertional cp, hempoptysis, orthopnea pnd or change in chronic leg swelling. Also denies presyncope, palpitations, heartburn, abdominal pain, nausea, vomiting, diarrhea or change in bowel or urinary habits, dysuria,hematuria, rash, arthralgias, visual complaints, headache, numbness weakness or ataxia.     Objective:   Physical Exam   Gen. Pleasant, elderly , in no  distress ENT - no lesions, no post nasal drip Neck: No JVD, no thyromegaly, no carotid bruits Lungs: no use of accessory muscles, no dullness to percussion, decreased without rales or rhonchi   Cardiovascular: Rhythm regular, heart sounds  normal, no murmurs or gallops, no peripheral edema Musculoskeletal: No deformities, no cyanosis or clubbing         Assessment & Plan:

## 2014-09-06 ENCOUNTER — Encounter: Payer: Self-pay | Admitting: Cardiology

## 2014-09-22 ENCOUNTER — Telehealth: Payer: Self-pay | Admitting: Pulmonary Disease

## 2014-09-22 MED ORDER — FLUTICASONE PROPIONATE HFA 44 MCG/ACT IN AERO: 2.0000 | INHALATION_SPRAY | Freq: Two times a day (BID) | RESPIRATORY_TRACT | Status: DC

## 2014-09-22 MED ORDER — ALBUTEROL SULFATE (2.5 MG/3ML) 0.083% IN NEBU: 2.5000 mg | INHALATION_SOLUTION | Freq: Four times a day (QID) | RESPIRATORY_TRACT | Status: DC | PRN

## 2014-09-22 MED ORDER — IPRATROPIUM BROMIDE HFA 17 MCG/ACT IN AERS: INHALATION_SPRAY | RESPIRATORY_TRACT | Status: DC

## 2014-09-22 NOTE — Telephone Encounter (Signed)
Pt is aware that we sent atrovent and flovent to mail in pharmacy and albuterol neb to local pharmacy listed(confirmed pharmacies). Simvastatin should come from his PCP and patient will contact their office.

## 2014-09-26 ENCOUNTER — Telehealth: Payer: Self-pay | Admitting: Pulmonary Disease

## 2014-09-26 NOTE — Telephone Encounter (Signed)
LMTCB

## 2014-09-27 ENCOUNTER — Telehealth: Payer: Self-pay | Admitting: Pulmonary Disease

## 2014-09-27 NOTE — Telephone Encounter (Signed)
How many puffs pt can take and how many inhalers for a 90 day supply.  gabe- [phone] Please leave first and full name when calling them back

## 2014-09-27 NOTE — Telephone Encounter (Signed)
lmomtcb x1 for Gab

## 2014-09-27 NOTE — Telephone Encounter (Signed)
Orignal message closed in error.  How many puffs pt can take and how many inhalers for a 90 day supply.  gabe- [phone] Please leave first and full name when calling them back

## 2014-09-28 NOTE — Telephone Encounter (Signed)
lmtcb x2 for Gabe.

## 2014-09-29 MED ORDER — IPRATROPIUM BROMIDE HFA 17 MCG/ACT IN AERS: INHALATION_SPRAY | RESPIRATORY_TRACT | Status: DC

## 2014-09-29 NOTE — Telephone Encounter (Signed)
Called and spoke to [place]. Information given to [facility]abe. Nothing further needed.

## 2014-11-22 NOTE — Progress Notes (Signed)
Cardiology Office Note   Date:  11/23/2014   ID:  Jorge [name], DOB 04-03-1941, MRN [id number]  PCP:  Thora Lance, MD    Chief Complaint  Patient presents with  . Coronary Artery Disease  . Follow-up    hyperlipidemia      History of Present Illness: Jorge [name] is a 74 y.o. male with a history of ASCAD with PCI of the LAD/D1 and cutting balloon to the left circ marginal in 2003, dyslipidemia and PVC's who presents today for followup. He is doing well. He denies any anginal chest pain, LE edema, dizziness, palpitations or syncope. He has chronic DOE which he thinks has continued to decline due to the pollen and COPD.  He does have some indigestion due to GERD.        Past Medical History  Diagnosis Date  . Hypertension   . COPD (chronic obstructive pulmonary disease)   . CAD (coronary artery disease) 2003    s/p stent placement ( stent to LAD/D1 and cutting balloon to LCirc marginal)  . BPH (benign prostatic hypertrophy)   . Colon polyp   . Hypercholesteremia   . PVC's (premature ventricular contractions)   . MRSA (methicillin resistant staphylococcus aureus) pneumonia 10/2009    Past Surgical History  Procedure Laterality Date  . Cholecystectomy    . Coronary angioplasty       Current Outpatient Prescriptions  Medication Sig Dispense Refill  . albuterol (PROVENTIL) (2.5 MG/3ML) 0.083% nebulizer solution Take 3 mLs (2.5 mg total) by nebulization every 6 (six) hours as needed. 300 mL 5  . aspirin 81 MG tablet Take 81 mg by mouth daily.      . fluticasone (FLOVENT HFA) 44 MCG/ACT inhaler Inhale 2 puffs into the lungs 2 (two) times daily. 3 Inhaler 0  . ipratropium (ATROVENT HFA) 17 MCG/ACT inhaler 2-4 times daily PRN 3 Inhaler 3  . simvastatin (ZOCOR) 80 MG tablet Take 1 tablet by mouth at bedtime.  1   No current facility-administered medications for this visit.    Allergies:   Adenosine; Codeine; Erythromycin; Sudafed; and Sulfa antibiotics     Social History:  The patient  reports that he quit smoking about 5 years ago. His smoking use included Cigarettes. He has a 50 pack-year smoking history. He does not have any smokeless tobacco history on file. He reports that he does not drink alcohol or use illicit drugs.   Family History:  The patient's family history includes Asthma in his father; Breast cancer in his mother; CAD in his father; Cancer - Lung in his mother; Lung cancer in his mother.    ROS:  Please see the history of present illness.   Otherwise, review of systems are positive for fatigue, wheezing and depression  PHYSICAL EXAM: VS:  BP 122/80 mmHg  Pulse 96  Ht  (1.676 m)  Wt 143 lb 12.8 oz (65.227 kg)  BMI 23.22 kg/m2 , BMI Body mass index is 23.22 kg/(m^2). GEN: Well nourished, well developed, in no acute distress HEENT: normal Neck: no JVD, carotid bruits, or masses Cardiac: RRR; no murmurs, rubs, or gallops,no edema  Respiratory:  Decreased breath sounds throughout GI: soft, nontender, nondistended, + BS MS: no deformity or atrophy Skin: warm and dry, no rash Neuro:  Strength and sensation are intact Psych: euthymic mood, full affect   EKG:  EKG was ordered today and showed NSR with RAE and septal infarct otherwise no ischemia   Recent Labs: 12/22/2013: Pro  B Natriuretic peptide (BNP) 17.0    Lipid Panel    Component Value Date/Time   CHOL  10/17/2009 0315    33        ATP III CLASSIFICATION:  <200     mg/dL   Desirable  675-916  mg/dL   Borderline High  >=384    mg/dL   High          TRIG 45 10/17/2009 0315   HDL <10* 10/17/2009 0315   CHOLHDL NOT CALCULATED 10/17/2009 0315   VLDL 9 10/17/2009 0315   LDLCALC NOT CALCULATED 10/17/2009 0315      Wt Readings from Last 3 Encounters:  11/23/14 143 lb 12.8 oz (65.227 kg)  06/12/14 146 lb 12.8 oz (66.588 kg)  02/08/14 139 lb 6.4 oz (63.231 kg)    ASSESSMENT AND PLAN:  1. ASCAD with no anginal CP. - continue ASA 2. Dyslipidemia   - I will check an FLP and ALT 3. Asymptomatic PVC's   4.       RAE on EKG secondary to COPD   5.       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       Chronic SOB most likely due to underlying COPD with allergies.  He does not move much air on exam.  He had no ischemia on nuclear a year ago and echo with mild LV dysfunction.  I will check a BNP to make sure there is no volume overload contributing.     Current medicines are reviewed at length with the patient today.  The patient does not have concerns regarding medicines.  The following changes have been made:  no change  Labs/ tests ordered today include: see above assessment and plan  Orders Placed This Encounter  Procedures  . Basic Metabolic Panel (BMET)  . B Nat Peptide  . Hepatic function panel  . Lipid Profile  . EKG 12-Lead     Disposition:   FU with me in 6 months   Signed, Quintella Reichert, MD  11/23/2014 1:14 PM    [facility] [address] [place], [place], [place]  [address] Phone: [phone]; Fax: [phone]

## 2014-11-23 ENCOUNTER — Encounter: Payer: Self-pay | Admitting: Cardiology

## 2014-11-23 ENCOUNTER — Ambulatory Visit (INDEPENDENT_AMBULATORY_CARE_PROVIDER_SITE_OTHER): Payer: PPO | Admitting: Cardiology

## 2014-11-23 VITALS — BP 122/80 | HR 96 | Ht 66.0 in | Wt 143.8 lb

## 2014-11-23 DIAGNOSIS — E7800 Pure hypercholesterolemia, unspecified: Secondary | ICD-10-CM

## 2014-11-23 DIAGNOSIS — I2510 Atherosclerotic heart disease of native coronary artery without angina pectoris: Secondary | ICD-10-CM | POA: Diagnosis not present

## 2014-11-23 DIAGNOSIS — R0602 Shortness of breath: Secondary | ICD-10-CM

## 2014-11-23 DIAGNOSIS — I493 Ventricular premature depolarization: Secondary | ICD-10-CM

## 2014-11-23 LAB — BASIC METABOLIC PANEL
BUN: 10 mg/dL (ref 6–23)
CALCIUM: 9.5 mg/dL (ref 8.4–10.5)
CHLORIDE: 104 meq/L (ref 96–112)
CO2: 31 meq/L (ref 19–32)
Creatinine, Ser: 0.94 mg/dL (ref 0.40–1.50)
GFR: 83.07 mL/min (ref >60.00)
GLUCOSE: 89 mg/dL (ref 70–99)
Potassium: 3.7 mEq/L (ref 3.5–5.1)
Sodium: 140 mEq/L (ref 135–145)

## 2014-11-23 LAB — BRAIN NATRIURETIC PEPTIDE: Pro B Natriuretic peptide (BNP): 23 pg/mL (ref 0.0–100.0)

## 2014-11-23 NOTE — Patient Instructions (Signed)
Medication Instructions:  Your physician recommends that you continue on your current medications as directed. Please refer to the Current Medication list given to you today.   Labwork: TODAY: BMET, BNP  Your physician recommends that you return for FASTING lab work on Monday, November 27, 2014. You may come ANY TIME between 7:30 AM and 5:00 PM.  Testing/Procedures: None  Follow-Up: Your physician wants you to follow-up in: 6 months with Dr. Mayford Knife. You will receive a reminder letter in the mail two months in advance. If you don't receive a letter, please call our office to schedule the follow-up appointment.

## 2014-11-27 ENCOUNTER — Other Ambulatory Visit (INDEPENDENT_AMBULATORY_CARE_PROVIDER_SITE_OTHER): Payer: PPO | Admitting: *Deleted

## 2014-11-27 DIAGNOSIS — E7800 Pure hypercholesterolemia, unspecified: Secondary | ICD-10-CM

## 2014-11-27 LAB — LIPID PANEL
Cholesterol: 116 mg/dL (ref 0–200)
HDL: 40 mg/dL (ref >39.00)
LDL Cholesterol: 54 mg/dL (ref 0–99)
NONHDL: 76
Total CHOL/HDL Ratio: 3
Triglycerides: 110 mg/dL (ref 0.0–149.0)
VLDL: 22 mg/dL (ref 0.0–40.0)

## 2014-11-27 LAB — HEPATIC FUNCTION PANEL
ALT: 17 U/L (ref 0–53)
AST: 24 U/L (ref 0–37)
Albumin: 3.9 g/dL (ref 3.5–5.2)
Alkaline Phosphatase: 96 U/L (ref 39–117)
Bilirubin, Direct: 0.2 mg/dL (ref 0.0–0.3)
TOTAL PROTEIN: 7.1 g/dL (ref 6.0–8.3)
Total Bilirubin: 1 mg/dL (ref 0.2–1.2)

## 2014-11-28 ENCOUNTER — Encounter: Payer: Self-pay | Admitting: Cardiology

## 2014-11-28 NOTE — Telephone Encounter (Signed)
This encounter was created in error - please disregard.

## 2014-11-28 NOTE — Telephone Encounter (Signed)
New message ° ° ° ° °Returning Katy's call °

## 2015-02-28 ENCOUNTER — Ambulatory Visit (INDEPENDENT_AMBULATORY_CARE_PROVIDER_SITE_OTHER)
Admission: RE | Admit: 2015-02-28 | Discharge: 2015-02-28 | Disposition: A | Discharge status: Home / Self Care | Payer: PPO | Source: Ambulatory Visit | Attending: Internal Medicine | Admitting: Internal Medicine

## 2015-02-28 ENCOUNTER — Ambulatory Visit (INDEPENDENT_AMBULATORY_CARE_PROVIDER_SITE_OTHER): Payer: PPO | Admitting: Internal Medicine

## 2015-02-28 ENCOUNTER — Encounter: Payer: Self-pay | Admitting: Internal Medicine

## 2015-02-28 DIAGNOSIS — J441 Chronic obstructive pulmonary disease with (acute) exacerbation: Secondary | ICD-10-CM

## 2015-02-28 MED ORDER — BUDESONIDE-FORMOTEROL FUMARATE 160-4.5 MCG/ACT IN AERO: INHALATION_SPRAY | RESPIRATORY_TRACT | Status: DC

## 2015-02-28 MED ORDER — AMOXICILLIN-POT CLAVULANATE 875-125 MG PO TABS: 1.0000 | ORAL_TABLET | Freq: Two times a day (BID) | ORAL | Status: DC

## 2015-02-28 NOTE — Patient Instructions (Addendum)
Stop flovent and start symbicort 160 Take 2 puffs first thing in am and then another 2 puffs about 12 hours later.   atrovent 2 puffs four times daily   Only use your albuterol neb  as a rescue medication to be used if you can't catch your breath by resting or doing a relaxed purse lip breathing pattern.  - The less you use it, the better it will work when you need it. - Ok to use up to  Every 4 hours if your must   Augmentin 875 mg take one pill twice daily  X 10 days - take at breakfast and supper with large glass of water.  It would help reduce the usual side effects (diarrhea and yeast infections) if you ate cultured yogurt at lunch.   Please remember to go to the x-ray department downstairs for your tests - we will call you with the results when they are available.    Please schedule a follow up office visit in 4 weeks, sooner if needed to see Tammy NP or Dr Vassie Loll

## 2015-02-28 NOTE — Progress Notes (Signed)
Subjective:    Patient ID: Jorge [name], male    DOB: 09-21-1940    MRN: [id number]     Brief patient profile:  33 with CAD for FU of COPD  FEV1 38% in 2008  Adm 10/2009 for Right upper lobe MRSA pneumonia complicated by rt pneumothorax & persistent air leak  Quit smoking since feb '11.  Spiriva trial --like atrovent better  symbicort trial - no better   02/08/14  Says his breathing is slight worse, gets winded easily .  No flare in cough, or wheezing.  No ER /Hospital admits.  Remains on Flovent 2 puffs Twice daily And Atrovent Twice daily  CT chest 08/2013 showed An area of focal pleural  thickening and probable adjacent pulmonary parenchymal scarring in the left upper lobe , extensive   CT chest 01/19/14 - stable Chronic post infectious changes  throughout the lungs bilaterally, LUL ground glass opacity    02/28/2015 acute  ov/Zakyria Metzinger re: very poor hfa /insight into meds  Chief Complaint  Patient presents with  . Acute Visit    Pt c/o worsening SOB, with wheezing, chest pain and productive cough x 1 month. Pt states that mucus changes from clear to dark green. 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 No unusual exp hx or h/o childhood pna/ asthma or knowledge of premature birth.  Sleeping ok without nocturnal  or early am exacerbation  of respiratory  c/o's or need for noct saba. Also denies any obvious fluctuation of symptoms with weather or environmental changes or other aggravating or alleviating factors except as outlined above   Current Medications, Allergies, Complete Past Medical History,  Past Surgical History, Family History, and Social History were reviewed in [employer] record.  ROS  The following are not active complaints unless bolded sore throat, dysphagia, dental problems, itching, sneezing,  nasal congestion or excess/ purulent secretions, ear ache,   fever, chills, sweats, unintended wt loss, classically pleuritic or exertional cp, hemoptysis,  orthopnea pnd or leg swelling, presyncope, palpitations, abdominal pain, anorexia, nausea, vomiting, diarrhea  or change in bowel or bladder habits, change in stools or urine, dysuria,hematuria,  rash, arthralgias, visual complaints, headache, numbness, weakness or ataxia or problems with walking or coordination,  change in mood/affect or memory.            Objective:   Physical Exam   amb somber wm nad  Wt Readings from Last 3 Encounters:  02/28/15 141 lb 3.2 oz (64.048 kg)  11/23/14 143 lb 12.8 oz (65.227 kg)  06/12/14 146 lb 12.8 oz (66.588 kg)    Vital signs reviewed  HEENT: nl dentition, turbinates, and orophanx. Nl external ear canals without cough reflex   NECK :  without JVD/Nodes/TM/ nl carotid upstrokes bilaterally   LUNGS:   Barrel chest /  no acc muscle use,  Distant bs with faint end exp bilat wheeze   CV:  RRR  no s3 or murmur or increase in P2, no edema   ABD:  soft and  nontender with nl excursion in the supine position. No bruits or organomegaly, bowel sounds nl  MS:  warm without deformities, calf tenderness, cyanosis or clubbing  SKIN: warm and dry without lesions    NEURO:  alert, approp, no deficits     CXR PA and Lateral:   02/28/2015 :     I personally reviewed images and agree with radiology impression as follows:      Re- demonstration of changes of emphysema and lung scarring, with no evidence of lobar pneumonia or pleural effusion. Acute superimposed bronchitis difficult to exclude.  Unchanged appearance of pleural parenchymal scarring at the  lung apices.           Assessment & Plan:

## 2015-03-01 NOTE — Progress Notes (Signed)
Quick Note:  Called and spoke with pt. Reviewed results and recs. Pt voiced understanding and had no further questions. ______ 

## 2015-03-05 ENCOUNTER — Encounter: Payer: Self-pay | Admitting: Internal Medicine

## 2015-03-05 NOTE — Assessment & Plan Note (Addendum)
FEV1 38% in 2008 07/2012 >trial of symbicort >no change > rechallenged 02/28/15 as failing flovent  01/2014 - trial of spiriva > preferred atrovent qid   Moderate exac needs round of abx/ pred x 6 days but symptoms remain difficult to control  DDX of  difficult airways management all start with A and  include Adherence, Ace Inhibitors, Acid Reflux, Active Sinus Disease, Alpha 1 Antitripsin deficiency, Anxiety masquerading as Airways dz,  ABPA,  allergy(esp in young), Aspiration (esp in elderly), Adverse effects of meds,  Active smokers, A bunch of PE's (a small clot burden can't cause this syndrome unless there is already severe underlying pulm or vascular dz with poor reserve) plus two Bs  = Bronchiectasis and Beta blocker use..and one C= CHF  Adherence is always the initial "prime suspect" and is a multilayered concern that requires a "trust but verify" approach in every patient - starting with knowing how to use medications, especially inhalers, correctly, keeping up with refills and understanding the fundamental difference between maintenance and prns vs those medications only taken for a very short course and then stopped and not refilled.  The proper method of use, as well as anticipated side effects, of a metered-dose inhaler are discussed and demonstrated to the patient. Improved effectiveness after extensive coaching during this visit to a level of approximately  75% from a baseline of 25%  I doubt flovent was doing much for him and since he seems capable of learning hfa and "likes atrovent" which is hfa/ albeit with suboptimal technique rec trial of symbiocrt 160 2bid  ? Allergy /asthma component > Prednisone 10 mg take  4 each am x 2 days,   2 each am x 2 days,  1 each am x 2 days and stop ? Active sinus dz > Augmentin 875 mg take one pill twice daily  X 10 days -     I had an extended discussion with the patient reviewing all relevant studies completed to date and  lasting 15 to 20 minutes  of a 25 minute visit    Each maintenance medication was reviewed in detail including most importantly the difference between maintenance and prns and under what circumstances the prns are to be triggered using an action plan format that is not reflected in the computer generated alphabetically organized AVS.    Please see instructions for details which were reviewed in writing and the patient given a copy highlighting the part that I personally wrote and discussed at today's ov.

## 2015-03-14 ENCOUNTER — Encounter: Payer: Self-pay | Admitting: Cardiology

## 2015-03-29 ENCOUNTER — Encounter: Payer: Self-pay | Admitting: Adult Health

## 2015-03-29 ENCOUNTER — Ambulatory Visit (INDEPENDENT_AMBULATORY_CARE_PROVIDER_SITE_OTHER): Payer: PPO | Admitting: Adult Health

## 2015-03-29 VITALS — BP 110/80 | HR 77 | Temp 97.4°F | Ht 66.0 in | Wt 140.0 lb

## 2015-03-29 DIAGNOSIS — J449 Chronic obstructive pulmonary disease, unspecified: Secondary | ICD-10-CM

## 2015-03-29 NOTE — Patient Instructions (Signed)
Continue on Symbicort 2 puffs Twice daily   Continue on Ipratropium 2 puffs .Four times a day   Follow up with Dr. Alva  In 2 months and As needed   Please contact office for sooner follow up if symptoms do not improve or worsen or seek emergency care   

## 2015-04-03 NOTE — Progress Notes (Signed)
Subjective:    Patient ID: Jorge [name], male    DOB: 1940-10-06    MRN: [id number]     Brief patient profile:  9 with CAD for FU of COPD  FEV1 38% in 2008  Adm 10/2009 for Right upper lobe MRSA pneumonia complicated by rt pneumothorax & persistent air leak  Quit smoking since feb '11.  Spiriva trial --like atrovent better  symbicort trial - no better   02/08/14  Says his breathing is slight worse, gets winded easily .  No flare in cough, or wheezing.  No ER /Hospital admits.  Remains on Flovent 2 puffs Twice daily And Atrovent Twice daily  CT chest 08/2013 showed An area of focal pleural  thickening and probable adjacent pulmonary parenchymal scarring in the left upper lobe , extensive   CT chest 01/19/14 - stable Chronic post infectious changes  throughout the lungs bilaterally, LUL ground glass opacity    02/28/2015 acute  ov/[facility]ert re: very poor hfa /insight into meds  Chief Complaint  Patient presents with  . Acute Visit    Pt c/o worsening SOB, with wheezing, chest pain and productive cough x 1 month. Pt states that mucus changes from clear to dark green. 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 He was treated with Augmentin for 10 days.. Is also changed her Flovent to Symbicort. Patient does feel better, but he has some lingering cough and congestion. Denies any hemoptysis, chest pain, orthopnea, PND or leg swelling. Chest x-ray last time showed COPD changes.  Current Medications, Allergies, Complete Past Medical History, Past Surgical History, Family  History, and Social History were reviewed in [employer] record.  ROS  The following are not active complaints unless bolded sore throat, dysphagia, dental problems, itching, sneezing,  nasal congestion or excess/ purulent secretions, ear ache,   fever, chills, sweats, unintended wt loss, classically pleuritic or exertional cp, hemoptysis,  orthopnea pnd or leg swelling, presyncope, palpitations, abdominal pain, anorexia, nausea, vomiting, diarrhea  or change in bowel or bladder habits, change in stools or urine, dysuria,hematuria,  rash, arthralgias, visual complaints, headache, numbness, weakness or ataxia or problems with walking or coordination,  change in mood/affect or memory.            Objective:   Physical Exam   amb  wm nad   Vital signs reviewed  HEENT: nl dentition, turbinates, and orophanx. Nl external ear canals without cough reflex   NECK :  without JVD/Nodes/TM/ nl carotid upstrokes bilaterally   LUNGS:   Barrel chest /  no acc muscle use,  Distant bs     CV:  RRR  no s3 or murmur or increase in P2, no edema   ABD:  soft and nontender with nl excursion in the supine position. No bruits or organomegaly, bowel sounds nl  MS:  warm without deformities, calf tenderness, cyanosis or clubbing  SKIN: warm and dry without lesions    NEURO:  alert, approp, no deficits  CXR PA and Lateral:   02/28/2015 :       Re- demonstration of changes of emphysema and lung scarring, with no evidence of lobar pneumonia or pleural effusion. Acute superimposed bronchitis difficult to exclude.  Unchanged appearance of pleural parenchymal scarring at the lung apices.           Assessment & Plan:

## 2015-04-03 NOTE — Assessment & Plan Note (Signed)
Continue on Symbicort 2 puffs Twice daily   Continue on Ipratropium 2 puffs .Four times a day   Follow up with Dr. Vassie Loll  In 2 months and As needed   Please contact office for sooner follow up if symptoms do not improve or worsen or seek emergency care

## 2015-05-17 ENCOUNTER — Encounter: Payer: Self-pay | Admitting: Cardiology

## 2015-05-29 ENCOUNTER — Encounter: Payer: Self-pay | Admitting: Pulmonary Disease

## 2015-05-29 ENCOUNTER — Ambulatory Visit (INDEPENDENT_AMBULATORY_CARE_PROVIDER_SITE_OTHER): Payer: PPO | Admitting: Pulmonary Disease

## 2015-05-29 VITALS — BP 130/70 | HR 80 | Ht 66.0 in | Wt 141.2 lb

## 2015-05-29 DIAGNOSIS — J432 Centrilobular emphysema: Secondary | ICD-10-CM | POA: Diagnosis not present

## 2015-05-29 DIAGNOSIS — R918 Other nonspecific abnormal finding of lung field: Secondary | ICD-10-CM | POA: Diagnosis not present

## 2015-05-29 DIAGNOSIS — J441 Chronic obstructive pulmonary disease with (acute) exacerbation: Secondary | ICD-10-CM | POA: Diagnosis not present

## 2015-05-29 DIAGNOSIS — Z23 Encounter for immunization: Secondary | ICD-10-CM | POA: Diagnosis not present

## 2015-05-29 NOTE — Assessment & Plan Note (Signed)
CT scan of chest - 1 y FU

## 2015-05-29 NOTE — Patient Instructions (Addendum)
Flu shot Hoarseness may be a side effect of symbicort - call if worse & we can change CT scan of chest

## 2015-05-29 NOTE — Assessment & Plan Note (Signed)
Flu shot Hoarseness may be a side effect of symbicort - ihe will call if worse & change back to flovent + atrovent combination

## 2015-05-29 NOTE — Progress Notes (Signed)
   Subjective:    Patient ID: Jorge [name], male    DOB: 09-05-1940, 74 y.o.   MRN: [id number]  HPI   73/M with CAD for FU of COPD  FEV1 38% in 2008  Adm 10/2009 for Right upper lobe MRSA pneumonia complicated by rt pneumothorax & persistent air leak  Quit smoking since feb '11.   Meds -Spiriva trial --like atrovent better  symbicort trial - no better   03/2015 - restarted symbicort - MW  05/29/2015  Chief Complaint  Patient presents with  . Follow-up    SOB, chest tightness, pain on left side when breathing too deeply.   Flu shot   He was maintained on a regimen of Flovent and Atrovent-was changed to Symbicort in 03/2015. Since then he C/o hoarseness , shaking more Feels his breathing is slight worse, gets winded easily .  No flare in cough, or wheezing.  No ER /Hospital admits.  Depressed due to family situation, son has ETOH/ addiction issues, daughter  Is in prison    CT chest 01/19/14 - stable Chronic post infectious changes  throughout the lungs bilaterally, LUL ground glass opacity, stable compared to 08/2013   Review of Systems neg for any significant sore throat, dysphagia, itching, sneezing, nasal congestion or excess/ purulent secretions, fever, chills, sweats, unintended wt loss, pleuritic or exertional cp, hempoptysis, orthopnea pnd or change in chronic leg swelling. Also denies presyncope, palpitations, heartburn, abdominal pain, nausea, vomiting, diarrhea or change in bowel or urinary habits, dysuria,hematuria, rash, arthralgias, visual complaints, headache, numbness weakness or ataxia.     Objective:   Physical Exam  Gen. Pleasant, well-nourished, in no distress ENT - no lesions, no post nasal drip Neck: No JVD, no thyromegaly, no carotid bruits Lungs: no use of accessory muscles, no dullness to percussion, clear without rales or rhonchi  Cardiovascular: Rhythm regular, heart sounds  normal, no murmurs or gallops, no peripheral edema Musculoskeletal: No  deformities, no cyanosis or clubbing         Assessment & Plan:

## 2015-06-07 ENCOUNTER — Ambulatory Visit (INDEPENDENT_AMBULATORY_CARE_PROVIDER_SITE_OTHER)
Admission: RE | Admit: 2015-06-07 | Discharge: 2015-06-07 | Disposition: A | Discharge status: Home / Self Care | Payer: PPO | Source: Ambulatory Visit | Attending: Pulmonary Disease | Admitting: Pulmonary Disease

## 2015-06-07 DIAGNOSIS — R918 Other nonspecific abnormal finding of lung field: Secondary | ICD-10-CM

## 2015-06-07 DIAGNOSIS — J432 Centrilobular emphysema: Secondary | ICD-10-CM | POA: Diagnosis not present

## 2015-06-28 ENCOUNTER — Ambulatory Visit (INDEPENDENT_AMBULATORY_CARE_PROVIDER_SITE_OTHER): Payer: PPO | Admitting: Cardiology

## 2015-06-28 ENCOUNTER — Ambulatory Visit: Payer: PPO | Admitting: Cardiology

## 2015-06-28 ENCOUNTER — Encounter: Payer: Self-pay | Admitting: Cardiology

## 2015-06-28 VITALS — BP 122/82 | HR 80 | Ht 66.0 in | Wt 136.4 lb

## 2015-06-28 DIAGNOSIS — E7800 Pure hypercholesterolemia, unspecified: Secondary | ICD-10-CM

## 2015-06-28 DIAGNOSIS — I493 Ventricular premature depolarization: Secondary | ICD-10-CM | POA: Diagnosis not present

## 2015-06-28 DIAGNOSIS — I2510 Atherosclerotic heart disease of native coronary artery without angina pectoris: Secondary | ICD-10-CM | POA: Diagnosis not present

## 2015-06-28 NOTE — Patient Instructions (Signed)

## 2015-06-28 NOTE — Progress Notes (Signed)
Cardiology Office Note   Date:  06/28/2015   ID:  Jorge [name], DOB 05-16-1941, MRN [id number]  PCP:  Thora Lance, MD    Chief Complaint  Patient presents with  . Coronary Artery Disease  . Hyperlipidemia      History of Present Illness: Jorge [name] is a 74 y.o. male with a history of ASCAD with PCI of the LAD/D1 and cutting balloon to the left circ marginal in 2003, dyslipidemia and PVC's who presents today for followup. He is doing well. He denies any anginal chest pain, LE edema, dizziness, palpitations or syncope. He has chronic DOE which he thinks has continued to decline due to the pollen and COPD. He is followed by Pulmonary and is on inhalers. He does have some indigestion due to GERD.      Past Medical History  Diagnosis Date  . Hypertension   . COPD (chronic obstructive pulmonary disease) (HCC)   . CAD (coronary artery disease) 2003    s/p stent placement ( stent to LAD/D1 and cutting balloon to LCirc marginal)  . BPH (benign prostatic hypertrophy)   . Colon polyp   . Hypercholesteremia   . PVC's (premature ventricular contractions)   . MRSA (methicillin resistant staphylococcus aureus) pneumonia (HCC) 10/2009    Past Surgical History  Procedure Laterality Date  . Cholecystectomy    . Coronary angioplasty       Current Outpatient Prescriptions  Medication Sig Dispense Refill  . albuterol (PROVENTIL) (2.5 MG/3ML) 0.083% nebulizer solution Take 2.5 mg by nebulization every 6 (six) hours as needed for wheezing or shortness of breath.    Marland Kitchen aspirin 81 MG tablet Take 81 mg by mouth daily.      . budesonide-formoterol (SYMBICORT) 160-4.5 MCG/ACT inhaler Take 2 puffs first thing in am and then another 2 puffs about 12 hours later. 1 Inhaler 11  . ipratropium (ATROVENT HFA) 17 MCG/ACT inhaler Take 2 puffs into the lungs 2-4 times daily as needed for wheezing.    . simvastatin (ZOCOR) 80 MG tablet Take 1 tablet by mouth at bedtime.   1   No current facility-administered medications for this visit.    Allergies:   Adenosine; Codeine; Erythromycin; Sudafed; and Sulfa antibiotics    Social History:  The patient  reports that he quit smoking about 5 years ago. His smoking use included Cigarettes. He has a 50 pack-year smoking history. He does not have any smokeless tobacco history on file. He reports that he does not drink alcohol or use illicit drugs.   Family History:  The patient's family history includes Asthma in his father; Breast cancer in his mother; CAD in his father; Cancer - Lung in his mother; Lung cancer in his mother.    ROS:  Please see the history of present illness.   Otherwise, review of systems are positive for none.   All other systems are reviewed and negative.    PHYSICAL EXAM: VS:  BP 122/82 mmHg  Pulse 80  Ht  (1.676 m)  Wt 61.871 kg (136 lb 6.4 oz)  BMI 22.03 kg/m2 , BMI Body mass index is 22.03 kg/(m^2). GEN: Well nourished, well developed, in no acute distress HEENT: normal Neck: no JVD, carotid bruits, or masses Cardiac: RRR; no murmurs, rubs, or gallops,no edema  Respiratory:  clear to auscultation bilaterally, normal work of breathing GI: soft, nontender, nondistended, + BS MS: no deformity  or atrophy Skin: warm and dry, no rash Neuro:  Strength and sensation are intact Psych: euthymic mood, full affect   EKG:  EKG was ordered today NSR with no ST changes     Recent Labs: 11/23/2014: BUN 10; Creatinine, Ser 0.94; Potassium 3.7; Pro B Natriuretic peptide (BNP) 23.0; Sodium 140 11/27/2014: ALT 17    Lipid Panel    Component Value Date/Time   CHOL 116 11/27/2014 0833   TRIG 110.0 11/27/2014 0833   HDL 40.00 11/27/2014 0833   CHOLHDL 3 11/27/2014 0833   VLDL 22.0 11/27/2014 0833   LDLCALC 54 11/27/2014 0833      Wt Readings from Last 3 Encounters:  06/28/15 61.871 kg (136 lb 6.4 oz)  05/29/15 64.048 kg (141 lb 3.2 oz)  03/29/15 63.504 kg (140 lb)    ASSESSMENT  AND PLAN:  1. ASCAD with no anginal CP. - continue ASA 2. Dyslipidemia - continue statin - I will check an FLP and ALT 3. 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 He does not move much air on exam. He had no ischemia on last  Nuclear.BNP has been normal.      Current medicines are reviewed at length with the patient today.  The patient does not have concerns regarding medicines.  The following changes have been made:  no change  Labs/ tests ordered today: See above Assessment and Plan No orders of the defined types were placed in this encounter.     Disposition:   FU with me in 6 months  Signed, Quintella Reichert, MD  06/28/2015 11:53 AM    [facility] [address] [place], [place], [place]  [address] Phone: [phone]; Fax: [phone]

## 2015-07-02 ENCOUNTER — Other Ambulatory Visit (INDEPENDENT_AMBULATORY_CARE_PROVIDER_SITE_OTHER): Payer: PPO | Admitting: *Deleted

## 2015-07-02 DIAGNOSIS — E7800 Pure hypercholesterolemia, unspecified: Secondary | ICD-10-CM | POA: Diagnosis not present

## 2015-07-02 LAB — HEPATIC FUNCTION PANEL
ALBUMIN: 4.1 g/dL (ref 3.6–5.1)
ALT: 14 U/L (ref 9–46)
AST: 21 U/L (ref 10–35)
Alkaline Phosphatase: 164 U/L — ABNORMAL HIGH (ref 40–115)
BILIRUBIN INDIRECT: 0.8 mg/dL (ref 0.2–1.2)
Bilirubin, Direct: 0.2 mg/dL (ref <=0.2)
TOTAL PROTEIN: 7.8 g/dL (ref 6.1–8.1)
Total Bilirubin: 1 mg/dL (ref 0.2–1.2)

## 2015-07-02 LAB — LIPID PANEL
Cholesterol: 130 mg/dL (ref 125–200)
HDL: 39 mg/dL — AB (ref >=40)
LDL Cholesterol: 74 mg/dL (ref <130)
TRIGLYCERIDES: 84 mg/dL (ref <150)
Total CHOL/HDL Ratio: 3.3 Ratio (ref <=5.0)
VLDL: 17 mg/dL (ref <30)

## 2015-07-06 ENCOUNTER — Ambulatory Visit: Payer: PPO | Admitting: Cardiology

## 2015-07-20 ENCOUNTER — Other Ambulatory Visit: Payer: Self-pay | Admitting: Family Medicine

## 2015-07-20 DIAGNOSIS — R748 Abnormal levels of other serum enzymes: Secondary | ICD-10-CM

## 2015-07-27 ENCOUNTER — Ambulatory Visit
Admission: RE | Admit: 2015-07-27 | Discharge: 2015-07-27 | Disposition: A | Discharge status: Home / Self Care | Payer: PPO | Source: Ambulatory Visit | Attending: Family Medicine | Admitting: Family Medicine

## 2015-07-27 DIAGNOSIS — R748 Abnormal levels of other serum enzymes: Secondary | ICD-10-CM

## 2015-09-06 DIAGNOSIS — E7800 Pure hypercholesterolemia, unspecified: Secondary | ICD-10-CM | POA: Diagnosis not present

## 2015-09-06 DIAGNOSIS — R740 Nonspecific elevation of levels of transaminase and lactic acid dehydrogenase [LDH]: Secondary | ICD-10-CM | POA: Diagnosis not present

## 2015-10-01 ENCOUNTER — Ambulatory Visit: Payer: PPO | Admitting: Adult Health

## 2015-10-01 ENCOUNTER — Telehealth: Payer: Self-pay | Admitting: Adult Health

## 2015-10-01 NOTE — Telephone Encounter (Signed)
Called spoke with pt. He was not sick and just needed to schedule his f/u appt. appt r/s for tomorrow with TP at 2:30. Nothing further needed

## 2015-10-02 ENCOUNTER — Ambulatory Visit (INDEPENDENT_AMBULATORY_CARE_PROVIDER_SITE_OTHER): Payer: PPO | Admitting: Adult Health

## 2015-10-02 ENCOUNTER — Encounter: Payer: Self-pay | Admitting: Adult Health

## 2015-10-02 VITALS — BP 134/76 | HR 90 | Temp 98.4°F | Ht 66.0 in | Wt 140.0 lb

## 2015-10-02 DIAGNOSIS — J441 Chronic obstructive pulmonary disease with (acute) exacerbation: Secondary | ICD-10-CM

## 2015-10-02 MED ORDER — DOXYCYCLINE HYCLATE 100 MG PO TABS: 100.0000 mg | ORAL_TABLET | Freq: Two times a day (BID) | ORAL | Status: DC

## 2015-10-02 MED ORDER — BUDESONIDE-FORMOTEROL FUMARATE 160-4.5 MCG/ACT IN AERO: INHALATION_SPRAY | RESPIRATORY_TRACT | Status: DC

## 2015-10-02 NOTE — Assessment & Plan Note (Signed)
Flare with URI   Plan  Doxycycline 100mg  Twice daily  For 7days -take with food.  Mucinex DM Twice daily  As needed  Cough/congestion  Continue on Symbicort 2 puffs Twice daily  , rinse after use.  Follow up Dr. Vassie Loll  In 4 months and As needed   Please contact office for sooner follow up if symptoms do not improve or worsen or seek emergency care

## 2015-10-02 NOTE — Addendum Note (Signed)
Addended by: Karalee Height on: 10/02/2015 03:29 PM   Modules accepted: Orders

## 2015-10-02 NOTE — Progress Notes (Signed)
Subjective:    Patient ID: Jorge [name], male    DOB: [birth year], 75 y.o.   MRN: [id number]  HPI   74/M with CAD for FU of COPD  FEV1 38% in 2008  Adm 10/2009 for Right upper lobe MRSA pneumonia complicated by rt pneumothorax & persistent air leak  Quit smoking since feb '11.   Meds -Spiriva trial --like atrovent better  symbicort trial - no better   03/2015 - restarted symbicort - MW  He was maintained on a regimen of Flovent and Atrovent-was changed to Symbicort in 03/2015. Depressed due to family situation, son has ETOH/ addiction issues, daughter  Is in prison    CT chest 01/19/14 - stable Chronic post infectious changes  throughout the lungs bilaterally, LUL ground glass opacity, stable compared to 08/2013  10/02/2015 Follow up : COPD  Pt returns for 4 months follow up  Complains of  prod cough with white colored mucus, sinus pressure/drainage, increased SOB, wheezing, and chest congestion/tightness x 1 week. Denies any fever, nausea or vomiting. CT chest 05/2015 stable COPD changed and nodules.  Flu , PVX and Prevnar is utd.  Remains on Symbicort Twice daily  .     Past Medical History  Diagnosis Date  . Hypertension   . COPD (chronic obstructive pulmonary disease) (HCC)   . CAD (coronary artery disease) 2003    s/p stent placement ( stent to LAD/D1 and cutting balloon to LCirc marginal)  . BPH (benign prostatic hypertrophy)   . Colon polyp   . Hypercholesteremia   . PVC's (premature ventricular contractions)   . MRSA (methicillin resistant staphylococcus aureus) pneumonia (HCC) 10/2009   Current Outpatient Prescriptions on File Prior to Visit  Medication Sig Dispense Refill  . aspirin 81 MG tablet Take 81 mg by mouth daily.      . budesonide-formoterol (SYMBICORT) 160-4.5 MCG/ACT inhaler Take 2 puffs first thing in am and then another 2 puffs about 12 hours later. 1 Inhaler 11  . ipratropium (ATROVENT HFA) 17 MCG/ACT inhaler Take 2 puffs into the lungs 2-4 times  daily as needed for wheezing.    . simvastatin (ZOCOR) 80 MG tablet Take 1 tablet by mouth at bedtime.  1  . albuterol (PROVENTIL) (2.5 MG/3ML) 0.083% nebulizer solution Take 2.5 mg by nebulization every 6 (six) hours as needed for wheezing or shortness of breath. Reported on 10/02/2015     No current facility-administered medications on file prior to visit.     Review of Systems neg for any significant sore throat, dysphagia, itching, sneezing, nasal congestion or excess/ purulent secretions, fever, chills, sweats, unintended wt loss, pleuritic or exertional cp, hempoptysis, orthopnea pnd or change in chronic leg swelling. Also denies presyncope, palpitations, heartburn, abdominal pain, nausea, vomiting, diarrhea or change in bowel or urinary habits, dysuria,hematuria, rash, arthralgias, visual complaints, headache, numbness weakness or ataxia.     Objective:   Physical Exam Filed Vitals:   10/02/15 1432  BP: 134/76  Pulse: 90  Temp: 98.4 F (36.9 C)  TempSrc: Oral  Height: 5\' 6"  (1.676 m)  Weight: 140 lb (63.504 kg)  SpO2: 94%    Gen. Pleasant, elderly , in no distress ENT - no lesions, no post nasal drip Neck: No JVD, no thyromegaly, no carotid bruits Lungs: no use of accessory muscles, no dullness to percussion, decreased BS in bases  Cardiovascular: Rhythm regular, heart sounds  normal, no murmurs or gallops, no peripheral edema Musculoskeletal: No deformities, no cyanosis or clubbing  Assessment & Plan:

## 2015-10-02 NOTE — Patient Instructions (Signed)
Doxycycline 100mg  Twice daily  For 7days -take with food.  Mucinex DM Twice daily  As needed  Cough/congestion  Continue on Symbicort 2 puffs Twice daily  , rinse after use.  Follow up Dr. Vassie Loll  In 4 months and As needed   Please contact office for sooner follow up if symptoms do not improve or worsen or seek emergency care

## 2015-10-09 ENCOUNTER — Telehealth: Payer: Self-pay | Admitting: Adult Health

## 2015-10-09 MED ORDER — BUDESONIDE-FORMOTEROL FUMARATE 160-4.5 MCG/ACT IN AERO: INHALATION_SPRAY | RESPIRATORY_TRACT | Status: DC

## 2015-10-09 NOTE — Telephone Encounter (Signed)
Rx has been sent in. Nothing further was needed. 

## 2015-10-10 NOTE — Progress Notes (Signed)
Reviewed & agree with plan  

## 2015-11-20 ENCOUNTER — Telehealth: Payer: Self-pay | Admitting: Pulmonary Disease

## 2015-11-20 MED ORDER — IPRATROPIUM BROMIDE HFA 17 MCG/ACT IN AERS: INHALATION_SPRAY | RESPIRATORY_TRACT | Status: DC

## 2015-11-20 NOTE — Telephone Encounter (Signed)
Called and spoke with the pt. He needs a refill request for his Atrovent. Verified pharmacy as [facility]. He voiced understanding and had no further questions. Rx sent. Nothing further needed.

## 2015-11-26 ENCOUNTER — Telehealth: Payer: Self-pay | Admitting: Pulmonary Disease

## 2015-11-26 NOTE — Telephone Encounter (Signed)
LMTCB

## 2015-11-27 NOTE — Telephone Encounter (Signed)
Left detailed message (specified on lisa's named voicemail) stating how many inhalers pt needs for a 90 day supply of atrovent.  Nothing further needed.

## 2015-11-27 NOTE — Telephone Encounter (Signed)
Return call from pharm [phone] Misty Stanley .Caren Griffins

## 2015-12-25 ENCOUNTER — Ambulatory Visit (INDEPENDENT_AMBULATORY_CARE_PROVIDER_SITE_OTHER): Payer: PPO | Admitting: Cardiology

## 2015-12-25 ENCOUNTER — Ambulatory Visit: Payer: PPO | Admitting: Cardiology

## 2015-12-25 VITALS — BP 132/72 | HR 92 | Ht 68.0 in | Wt 140.4 lb

## 2015-12-25 DIAGNOSIS — E7800 Pure hypercholesterolemia, unspecified: Secondary | ICD-10-CM | POA: Diagnosis not present

## 2015-12-25 DIAGNOSIS — I493 Ventricular premature depolarization: Secondary | ICD-10-CM

## 2015-12-25 DIAGNOSIS — I2510 Atherosclerotic heart disease of native coronary artery without angina pectoris: Secondary | ICD-10-CM

## 2015-12-25 DIAGNOSIS — R0602 Shortness of breath: Secondary | ICD-10-CM | POA: Diagnosis not present

## 2015-12-25 NOTE — Progress Notes (Signed)
Cardiology Office Note    Date:  12/25/2015   ID:  [name], DOB [birth year], MRN [id number]  PCP:  Thora Lance, MD  Cardiologist:  Armanda Magic, MD   Chief Complaint  Patient presents with  . Coronary Artery Disease  . Hyperlipidemia    History of Present Illness:  Jorge [name] is a 75 y.o. male with a history of ASCAD with PCI of the LAD/D1 and cutting balloon to the left circ marginal in 2003, dyslipidemia and PVC's who presents today for followup. He is doing well. He denies any anginal chest pain, LE edema, dizziness, palpitations or syncope. He has chronic DOE  due to the pollen and COPD and says that this had been getting worse. He is followed by Pulmonary and is on inhalers. He does have some indigestion due to GERD.     Past Medical History  Diagnosis Date  . Hypertension   . COPD (chronic obstructive pulmonary disease) (HCC)   . CAD (coronary artery disease) 2003    s/p stent placement ( stent to LAD/D1 and cutting balloon to LCirc marginal)  . BPH (benign prostatic hypertrophy)   . Colon polyp   . Hypercholesteremia   . PVC's (premature ventricular contractions)   . MRSA (methicillin resistant staphylococcus aureus) pneumonia (HCC) 10/2009    Past Surgical History  Procedure Laterality Date  . Cholecystectomy    . Coronary angioplasty      Current Medications: Outpatient Prescriptions Prior to Visit  Medication Sig Dispense Refill  . albuterol (PROVENTIL) (2.5 MG/3ML) 0.083% nebulizer solution Take 2.5 mg by nebulization every 6 (six) hours as needed for wheezing or shortness of breath. Reported on 10/02/2015    . aspirin 81 MG tablet Take 81 mg by mouth daily.      . budesonide-formoterol (SYMBICORT) 160-4.5 MCG/ACT inhaler Take 2 puffs first thing in am and then another 2 puffs about 12 hours later. 3 Inhaler 1  . doxycycline (VIBRA-TABS) 100 MG tablet Take 1 tablet (100 mg total) by mouth 2 (two) times daily. 14 tablet 0  . ipratropium  (ATROVENT HFA) 17 MCG/ACT inhaler Take 2 puffs into the lungs 2-4 times daily as needed for wheezing. 1 Inhaler 5  . simvastatin (ZOCOR) 80 MG tablet Take 1 tablet by mouth at bedtime.  1   No facility-administered medications prior to visit.     Allergies:   Adenosine; Codeine; Erythromycin; Sudafed; and Sulfa antibiotics   Social History   Social History  . Marital Status: Widowed    Spouse Name: N/A  . Number of Children: N/A  . Years of Education: N/A   Occupational History  . Facilities manager at [employer]    Social History Main Topics  . Smoking status: Former Smoker -- 1.00 packs/day for 50 years    Types: Cigarettes    Quit date: 10/09/2009  . Smokeless tobacco: Not on file  . Alcohol Use: No  . Drug Use: No  . Sexual Activity: Not on file   Other Topics Concern  . Not on file   Social History Narrative     Family History:  The patient's family history includes Asthma in his father; Breast cancer in his mother; CAD in his father; Cancer - Lung in his mother; Lung cancer in his mother.   ROS:   Please see the history of present illness.    Review of Systems  Constitution: Positive for malaise/fatigue.  Respiratory: Positive for cough, shortness of breath and wheezing.  All other systems reviewed and are negative.   PHYSICAL EXAM:   VS:  BP 132/72 mmHg  Pulse 92  Ht 5\' 8"  (1.727 m)  Wt 140 lb 6.4 oz (63.685 kg)  BMI 21.35 kg/m2  SpO2 95%   GEN: Well nourished, well developed, in no acute distress HEENT: normal Neck: no JVD, carotid bruits, or masses Cardiac: RRR; no murmurs, rubs, or gallops,no edema.  Intact distal pulses bilaterally.  Respiratory:  clear to auscultation bilaterally, normal work of breathing GI: soft, nontender, nondistended, + BS MS: no deformity or atrophy Skin: warm and dry, no rash Neuro:  Alert and Oriented x 3, Strength and sensation are intact Psych: euthymic mood, full affect  Wt Readings from Last 3 Encounters:    12/25/15 140 lb 6.4 oz (63.685 kg)  10/02/15 140 lb (63.504 kg)  06/28/15 136 lb 6.4 oz (61.871 kg)      Studies/Labs Reviewed:   EKG:  EKG is not ordered today.    Recent Labs: 07/02/2015: ALT 14   Lipid Panel    Component Value Date/Time   CHOL 130 07/02/2015 0847   TRIG 84 07/02/2015 0847   HDL 39* 07/02/2015 0847   CHOLHDL 3.3 07/02/2015 0847   VLDL 17 07/02/2015 0847   LDLCALC 74 07/02/2015 0847    Additional studies/ records that were reviewed today include:  none    ASSESSMENT:    1. Atherosclerosis of native coronary artery of native heart without angina pectoris   2. PVC's (premature ventricular contractions)   3. Pure hypercholesterolemia   4. SOB (shortness of breath)      PLAN:  In order of problems listed above:  1. ASCAD  with PCI of the LAD/D1 and cutting balloon to the left circ marginal in 2003,  He has no anginal chest pain but has had worsening of his SOB.  I suspect this is due to progression of his COPD but I will get a nuclear stress test to rule out ischemia since it has been 2 years since he has had any type of ischemic evaluation.  .  Continue ASA/statin.   2. PVCs - asymptomatic 3. Dyslipidemia - continue statin.  Check FLP and ALT 4. SOB - I suspect is due to COPD but will get stress test to rule out ischemia.  He does not appear volume overloaded on exam.      Medication Adjustments/Labs and Tests Ordered: Current medicines are reviewed at length with the patient today.  Concerns regarding medicines are outlined above.  Medication changes, Labs and Tests ordered today are listed in the Patient Instructions below.  Patient Instructions  Medication Instructions:  Your physician recommends that you continue on your current medications as directed. Please refer to the Current Medication list given to you today.   Labwork: Your physician recommends that you return for FASTING lab work (the same day as stress  test).  Testing/Procedures: Your physician has requested that you have a lexiscan myoview. For further information please visit [email or web address] Please follow instruction sheet, as given.  Follow-Up: Your physician wants you to follow-up in: 1 year with Dr. Mayford Knife. You will receive a reminder letter in the mail two months in advance. If you don't receive a letter, please call our office to schedule the follow-up appointment.   Any Other Special Instructions Will Be Listed Below (If Applicable).     If you need a refill on your cardiac medications before your next appointment, please call your pharmacy.  Signed, Armanda Magic, MD  12/25/2015 2:36 PM    [facility] [address] [place], [place], [place]  [address] Phone: [phone]; Fax: [phone]

## 2015-12-25 NOTE — Patient Instructions (Signed)
Medication Instructions:  Your physician recommends that you continue on your current medications as directed. Please refer to the Current Medication list given to you today.   Labwork: Your physician recommends that you return for FASTING lab work (the same day as stress test).  Testing/Procedures: Your physician has requested that you have a lexiscan myoview. For further information please visit [email or web address] Please follow instruction sheet, as given.  Follow-Up: Your physician wants you to follow-up in: 1 year with Dr. Mayford Knife. You will receive a reminder letter in the mail two months in advance. If you don't receive a letter, please call our office to schedule the follow-up appointment.   Any Other Special Instructions Will Be Listed Below (If Applicable).     If you need a refill on your cardiac medications before your next appointment, please call your pharmacy.

## 2015-12-26 ENCOUNTER — Telehealth (HOSPITAL_COMMUNITY): Payer: Self-pay | Admitting: *Deleted

## 2015-12-26 NOTE — Telephone Encounter (Signed)
Patient given detailed instructions per Myocardial Perfusion Study Information Sheet for the test on 12/27/15 at 0745. Patient notified to arrive 15 minutes early and that it is imperative to arrive on time for appointment to keep from having the test rescheduled.  If you need to cancel or reschedule your appointment, please call the office within 24 hours of your appointment. Failure to do so may result in a cancellation of your appointment, and a $50 no show fee. Patient verbalized understanding.Jorge [name]    

## 2015-12-27 ENCOUNTER — Other Ambulatory Visit (INDEPENDENT_AMBULATORY_CARE_PROVIDER_SITE_OTHER): Payer: PPO | Admitting: *Deleted

## 2015-12-27 ENCOUNTER — Ambulatory Visit (HOSPITAL_COMMUNITY): Payer: PPO | Attending: Cardiology

## 2015-12-27 DIAGNOSIS — E7800 Pure hypercholesterolemia, unspecified: Secondary | ICD-10-CM | POA: Diagnosis not present

## 2015-12-27 DIAGNOSIS — Z8249 Family history of ischemic heart disease and other diseases of the circulatory system: Secondary | ICD-10-CM | POA: Diagnosis not present

## 2015-12-27 DIAGNOSIS — R0602 Shortness of breath: Secondary | ICD-10-CM | POA: Diagnosis not present

## 2015-12-27 DIAGNOSIS — I10 Essential (primary) hypertension: Secondary | ICD-10-CM | POA: Insufficient documentation

## 2015-12-27 DIAGNOSIS — I2510 Atherosclerotic heart disease of native coronary artery without angina pectoris: Secondary | ICD-10-CM | POA: Insufficient documentation

## 2015-12-27 LAB — HEPATIC FUNCTION PANEL
ALT: 13 U/L (ref 9–46)
AST: 19 U/L (ref 10–35)
Albumin: 3.9 g/dL (ref 3.6–5.1)
Alkaline Phosphatase: 69 U/L (ref 40–115)
BILIRUBIN DIRECT: 0.1 mg/dL (ref <=0.2)
BILIRUBIN INDIRECT: 0.6 mg/dL (ref 0.2–1.2)
Total Bilirubin: 0.7 mg/dL (ref 0.2–1.2)
Total Protein: 6.7 g/dL (ref 6.1–8.1)

## 2015-12-27 LAB — MYOCARDIAL PERFUSION IMAGING
CHL CUP NUCLEAR SDS: 0
CHL CUP NUCLEAR SRS: 5
CHL CUP RESTING HR STRESS: 73 {beats}/min
LV sys vol: 64 mL
LVDIAVOL: 106 mL (ref 62–150)
Peak HR: 94 {beats}/min
RATE: 0.28
SSS: 5
TID: 0.98

## 2015-12-27 LAB — LIPID PANEL
CHOL/HDL RATIO: 3.3 ratio (ref <=5.0)
Cholesterol: 118 mg/dL — ABNORMAL LOW (ref 125–200)
HDL: 36 mg/dL — AB (ref >=40)
LDL Cholesterol: 65 mg/dL (ref <130)
TRIGLYCERIDES: 85 mg/dL (ref <150)
VLDL: 17 mg/dL (ref <30)

## 2015-12-27 MED ORDER — REGADENOSON 0.4 MG/5ML IV SOLN
0.4000 mg | Freq: Once | INTRAVENOUS | Status: AC
  Administered 2015-12-27: 0.4 mg via INTRAVENOUS

## 2015-12-27 MED ORDER — TECHNETIUM TC 99M TETROFOSMIN IV KIT
10.7000 | PACK | Freq: Once | INTRAVENOUS | Status: AC | PRN
Start: 2015-12-27 — End: 2015-12-27
  Administered 2015-12-27: 11 via INTRAVENOUS
  Filled 2015-12-27: qty 11

## 2015-12-27 MED ORDER — TECHNETIUM TC 99M TETROFOSMIN IV KIT
32.7000 | PACK | Freq: Once | INTRAVENOUS | Status: AC | PRN
  Administered 2015-12-27: 32.7 via INTRAVENOUS
  Filled 2015-12-27: qty 33

## 2016-01-01 ENCOUNTER — Telehealth: Payer: Self-pay

## 2016-01-01 DIAGNOSIS — R0602 Shortness of breath: Secondary | ICD-10-CM

## 2016-01-01 NOTE — Telephone Encounter (Signed)
-----   Message from Quintella Reichert, MD sent at 12/30/2015  7:45 PM EDT ----- No ischemia and no change in mild LV dysfuntion since echo 2015.  Please refer back to his pulmonary MD for further evluation and also get a BNP

## 2016-01-01 NOTE — Telephone Encounter (Signed)
Informed patient of results and verbal understanding expressed.  BNP scheduled for 5/30. Patient has scheduled OV with Dr. Vassie Loll 6/6.  Forwarding to Dr. Vassie Loll for review. Patient agrees with treatment plan.

## 2016-01-08 ENCOUNTER — Other Ambulatory Visit (INDEPENDENT_AMBULATORY_CARE_PROVIDER_SITE_OTHER): Payer: PPO | Admitting: *Deleted

## 2016-01-08 DIAGNOSIS — R0602 Shortness of breath: Secondary | ICD-10-CM | POA: Diagnosis not present

## 2016-01-08 LAB — BRAIN NATRIURETIC PEPTIDE: Brain Natriuretic Peptide: 16.1 pg/mL (ref <100)

## 2016-01-15 ENCOUNTER — Ambulatory Visit: Payer: PPO | Admitting: Pulmonary Disease

## 2016-01-15 ENCOUNTER — Ambulatory Visit (INDEPENDENT_AMBULATORY_CARE_PROVIDER_SITE_OTHER): Payer: PPO | Admitting: Adult Health

## 2016-01-15 ENCOUNTER — Encounter: Payer: Self-pay | Admitting: Adult Health

## 2016-01-15 VITALS — BP 132/70 | HR 84 | Temp 97.5°F | Ht 66.0 in | Wt 140.8 lb

## 2016-01-15 DIAGNOSIS — J449 Chronic obstructive pulmonary disease, unspecified: Secondary | ICD-10-CM | POA: Diagnosis not present

## 2016-01-15 NOTE — Patient Instructions (Signed)
Continue on Symbicort 2 puffs Twice daily   Continue on Ipratropium 2 puffs .Three to Four times a day   Follow up with Dr. Vassie Loll  In 4-6 months and As needed   Please contact office for sooner follow up if symptoms do not improve or worsen or seek emergency care

## 2016-01-15 NOTE — Assessment & Plan Note (Signed)
Stable on current regimen  Decline spirometry today  Plan  Continue on Symbicort 2 puffs Twice daily   Continue on Ipratropium 2 puffs .Three to Four times a day   Follow up with Dr. Vassie Loll  In 4-6 months and As needed   Please contact office for sooner follow up if symptoms do not improve or worsen or seek emergency care

## 2016-01-15 NOTE — Progress Notes (Signed)
Subjective:    Patient ID: Jorge [name], male    DOB: 11-29-1940, 75 y.o.   MRN: [id number]  HPI   74/M with CAD for FU of COPD  FEV1 38% in 2008  Adm 10/2009 for Right upper lobe MRSA pneumonia complicated by rt pneumothorax & persistent air leak  Quit smoking since feb '11.   Meds -Spiriva trial --like atrovent better  symbicort trial - no better   03/2015 - restarted symbicort - MW  He was maintained on a regimen of Flovent and Atrovent-was changed to Symbicort in 03/2015. Depressed due to family situation, son has ETOH/ addiction issues, daughter  Is in prison    CT chest 01/19/14 - stable Chronic post infectious changes  throughout the lungs bilaterally, LUL ground glass opacity, stable compared to 08/2013  01/15/2016 Follow up : COPD  Pt returns for 4 months follow up  Says he gets winded very easily  . No flare of cough or wheezing .  Cough is mainly dry cough. . CT chest 05/2015 stable COPD changes and nodules.  Flu , PVX and Prevnar is utd.  Remains on Symbicort Twice daily  .  Uses Ipratropium inhaler 2 --3 times as day.  He denies chest pain, orthopnea, edema or fever.   Had a recent cardiac stress test that was stable w/ EF at 40%. No evidence of ischemia .     Past Medical History  Diagnosis Date  . Hypertension   . COPD (chronic obstructive pulmonary disease) (HCC)   . CAD (coronary artery disease) 2003    s/p stent placement ( stent to LAD/D1 and cutting balloon to LCirc marginal)  . BPH (benign prostatic hypertrophy)   . Colon polyp   . Hypercholesteremia   . PVC's (premature ventricular contractions)   . MRSA (methicillin resistant staphylococcus aureus) pneumonia (HCC) 10/2009   Current Outpatient Prescriptions on File Prior to Visit  Medication Sig Dispense Refill  . albuterol (PROVENTIL) (2.5 MG/3ML) 0.083% nebulizer solution Take 2.5 mg by nebulization every 6 (six) hours as needed for wheezing or shortness of breath. Reported on 10/02/2015    .  aspirin 81 MG tablet Take 81 mg by mouth daily.      . budesonide-formoterol (SYMBICORT) 160-4.5 MCG/ACT inhaler Take 2 puffs first thing in am and then another 2 puffs about 12 hours later. 3 Inhaler 1  . ipratropium (ATROVENT HFA) 17 MCG/ACT inhaler Take 2 puffs into the lungs 2-4 times daily as needed for wheezing. 1 Inhaler 5  . simvastatin (ZOCOR) 80 MG tablet Take 1 tablet by mouth at bedtime.  1   No current facility-administered medications on file prior to visit.     Review of Systems neg for any significant sore throat, dysphagia, itching, sneezing, nasal congestion or excess/ purulent secretions, fever, chills, sweats, unintended wt loss, pleuritic or exertional cp, hempoptysis, orthopnea pnd or change in chronic leg swelling. Also denies presyncope, palpitations, heartburn, abdominal pain, nausea, vomiting, diarrhea or change in bowel or urinary habits, dysuria,hematuria, rash, arthralgias, visual complaints, headache, numbness weakness or ataxia.     Objective:   Physical Exam Filed Vitals:   01/15/16 1428  BP: 132/70  Pulse: 84  Temp: 97.5 F (36.4 C)  TempSrc: Oral  Height: 5\' 6"  (1.676 m)  Weight: 140 lb 12.8 oz (63.866 kg)  SpO2: 95%    Gen. Pleasant, elderly , in no distress ENT - no lesions, no post nasal drip Neck: No JVD, no thyromegaly, no carotid bruits Lungs: no  use of accessory muscles, no dullness to percussion, decreased BS in bases  Cardiovascular: Rhythm regular, heart sounds  normal, no murmurs or gallops, no peripheral edema Musculoskeletal: No deformities, no cyanosis or clubbing   Tammy Parrett NP-C  [facility]  01/15/2016       Assessment & Plan:

## 2016-01-18 NOTE — Progress Notes (Signed)
Reviewed & agree with plan  

## 2016-01-21 DIAGNOSIS — Z139 Encounter for screening, unspecified: Secondary | ICD-10-CM | POA: Diagnosis not present

## 2016-01-21 DIAGNOSIS — L299 Pruritus, unspecified: Secondary | ICD-10-CM | POA: Diagnosis not present

## 2016-01-21 DIAGNOSIS — B9561 Methicillin susceptible Staphylococcus aureus infection as the cause of diseases classified elsewhere: Secondary | ICD-10-CM | POA: Diagnosis not present

## 2016-01-21 DIAGNOSIS — L0390 Cellulitis, unspecified: Secondary | ICD-10-CM | POA: Diagnosis not present

## 2016-03-21 DIAGNOSIS — H6691 Otitis media, unspecified, right ear: Secondary | ICD-10-CM | POA: Diagnosis not present

## 2016-03-21 DIAGNOSIS — R52 Pain, unspecified: Secondary | ICD-10-CM | POA: Diagnosis not present

## 2016-04-07 DIAGNOSIS — M8588 Other specified disorders of bone density and structure, other site: Secondary | ICD-10-CM | POA: Diagnosis not present

## 2016-04-07 DIAGNOSIS — M549 Dorsalgia, unspecified: Secondary | ICD-10-CM | POA: Diagnosis not present

## 2016-04-07 DIAGNOSIS — M545 Low back pain: Secondary | ICD-10-CM | POA: Diagnosis not present

## 2016-04-07 DIAGNOSIS — M47812 Spondylosis without myelopathy or radiculopathy, cervical region: Secondary | ICD-10-CM | POA: Diagnosis not present

## 2016-04-07 DIAGNOSIS — M47816 Spondylosis without myelopathy or radiculopathy, lumbar region: Secondary | ICD-10-CM | POA: Diagnosis not present

## 2016-04-07 DIAGNOSIS — M542 Cervicalgia: Secondary | ICD-10-CM | POA: Diagnosis not present

## 2016-04-07 DIAGNOSIS — Z981 Arthrodesis status: Secondary | ICD-10-CM | POA: Diagnosis not present

## 2016-04-07 DIAGNOSIS — I700 Atherosclerosis of aorta: Secondary | ICD-10-CM | POA: Diagnosis not present

## 2016-04-07 DIAGNOSIS — J929 Pleural plaque without asbestos: Secondary | ICD-10-CM | POA: Diagnosis not present

## 2016-04-07 DIAGNOSIS — M47814 Spondylosis without myelopathy or radiculopathy, thoracic region: Secondary | ICD-10-CM | POA: Diagnosis not present

## 2016-04-08 DIAGNOSIS — J449 Chronic obstructive pulmonary disease, unspecified: Secondary | ICD-10-CM | POA: Diagnosis not present

## 2016-04-08 DIAGNOSIS — M549 Dorsalgia, unspecified: Secondary | ICD-10-CM | POA: Diagnosis not present

## 2016-04-12 DIAGNOSIS — M549 Dorsalgia, unspecified: Secondary | ICD-10-CM | POA: Diagnosis not present

## 2016-04-12 DIAGNOSIS — R2689 Other abnormalities of gait and mobility: Secondary | ICD-10-CM | POA: Diagnosis not present

## 2016-04-12 DIAGNOSIS — R200 Anesthesia of skin: Secondary | ICD-10-CM | POA: Diagnosis not present

## 2016-04-12 DIAGNOSIS — M5432 Sciatica, left side: Secondary | ICD-10-CM | POA: Diagnosis not present

## 2016-04-12 DIAGNOSIS — Z87891 Personal history of nicotine dependence: Secondary | ICD-10-CM | POA: Diagnosis not present

## 2016-04-12 DIAGNOSIS — Z981 Arthrodesis status: Secondary | ICD-10-CM | POA: Diagnosis not present

## 2016-04-12 DIAGNOSIS — J449 Chronic obstructive pulmonary disease, unspecified: Secondary | ICD-10-CM | POA: Diagnosis not present

## 2016-04-12 DIAGNOSIS — M5442 Lumbago with sciatica, left side: Secondary | ICD-10-CM | POA: Diagnosis not present

## 2016-04-12 DIAGNOSIS — I509 Heart failure, unspecified: Secondary | ICD-10-CM | POA: Diagnosis not present

## 2016-04-16 DIAGNOSIS — M539 Dorsopathy, unspecified: Secondary | ICD-10-CM | POA: Diagnosis not present

## 2016-04-21 DIAGNOSIS — M5442 Lumbago with sciatica, left side: Secondary | ICD-10-CM | POA: Diagnosis not present

## 2016-04-21 DIAGNOSIS — R030 Elevated blood-pressure reading, without diagnosis of hypertension: Secondary | ICD-10-CM | POA: Diagnosis not present

## 2016-05-02 DIAGNOSIS — M4806 Spinal stenosis, lumbar region: Secondary | ICD-10-CM | POA: Diagnosis not present

## 2016-05-02 DIAGNOSIS — M5442 Lumbago with sciatica, left side: Secondary | ICD-10-CM | POA: Diagnosis not present

## 2016-05-12 DIAGNOSIS — M5442 Lumbago with sciatica, left side: Secondary | ICD-10-CM | POA: Diagnosis not present

## 2016-05-12 DIAGNOSIS — R030 Elevated blood-pressure reading, without diagnosis of hypertension: Secondary | ICD-10-CM | POA: Diagnosis not present

## 2016-05-13 ENCOUNTER — Other Ambulatory Visit: Payer: Self-pay | Admitting: Neurosurgery

## 2016-05-13 DIAGNOSIS — M5442 Lumbago with sciatica, left side: Secondary | ICD-10-CM

## 2016-05-20 ENCOUNTER — Encounter: Payer: Self-pay | Admitting: Pulmonary Disease

## 2016-05-20 ENCOUNTER — Ambulatory Visit (INDEPENDENT_AMBULATORY_CARE_PROVIDER_SITE_OTHER): Payer: PPO | Admitting: Pulmonary Disease

## 2016-05-20 DIAGNOSIS — R918 Other nonspecific abnormal finding of lung field: Secondary | ICD-10-CM | POA: Diagnosis not present

## 2016-05-20 DIAGNOSIS — J432 Centrilobular emphysema: Secondary | ICD-10-CM | POA: Diagnosis not present

## 2016-05-20 MED ORDER — PREDNISONE 10 MG PO TABS: ORAL_TABLET | ORAL | 0 refills | Status: DC

## 2016-05-20 MED ORDER — TIOTROPIUM BROMIDE-OLODATEROL 2.5-2.5 MCG/ACT IN AERS: 2.0000 | INHALATION_SPRAY | Freq: Every day | RESPIRATORY_TRACT | 0 refills | Status: DC

## 2016-05-20 NOTE — Assessment & Plan Note (Signed)
Stable 2016

## 2016-05-20 NOTE — Assessment & Plan Note (Signed)
Prescription for Stiolto respimat 2 puffs daily Use albuterol MDI 2 puffs every 6 hours as needed  Prednisone 10 mg tabs  Take 2 tabs daily with food x 5ds, then 1 tab daily with food x 5ds then STOP  

## 2016-05-20 NOTE — Progress Notes (Signed)
   Subjective:    Patient ID: Jorge [name], male    DOB: July 01, 1941, 75 y.o.   MRN: [id number]  HPI  74/M with CAD for FU of COPD  FEV1 38% in 2008  Adm 10/2009 for Right upper lobe MRSA pneumonia complicated by rt pneumothorax & persistent air leak  Quit smoking since feb '11.     05/20/2016  Chief Complaint  Patient presents with  . Follow-up    breathing is not doing well, SOB, chest tightness, non-productive cough.    His breathing has been worse for the past few weeks No nasal congestion or wheezing or URI symptoms No pedal edema His PCP gave him stiolto and this seemed to work better-he is out of Atrovent   Difficult family situation, son and 2 daughters have addiction issues  Significant tests/ events  CT chest 01/19/14 - stable Chronic post infectious changes  throughout the lungs bilaterally, LUL ground glass opacity, stable compared to 08/2013   CT chest 05/2015 stable COPD changes and nodules.     12/2015 cardiac stress test - w/ EF at 40%. No evidence of ischemia .     Review of Systems neg for any significant sore throat, dysphagia, itching, sneezing, nasal congestion or excess/ purulent secretions, fever, chills, sweats, unintended wt loss, pleuritic or exertional cp, hempoptysis, orthopnea pnd or change in chronic leg swelling.   Also denies presyncope, palpitations, heartburn, abdominal pain, nausea, vomiting, diarrhea or change in bowel or urinary habits, dysuria,hematuria, rash, arthralgias, visual complaints, headache, numbness weakness or ataxia.     Objective:   Physical Exam  Gen. Pleasant, well-nourished, in no distress ENT - no lesions, no post nasal drip Neck: No JVD, no thyromegaly, no carotid bruits Lungs: no use of accessory muscles, no dullness to percussion, Decreased without rales or rhonchi  Cardiovascular: Rhythm regular, heart sounds  normal, no murmurs or gallops, no peripheral edema Musculoskeletal: No deformities, no  cyanosis or clubbing        Assessment & Plan:

## 2016-05-20 NOTE — Patient Instructions (Signed)
Prescription for Stiolto respimat 2 puffs daily Use albuterol MDI 2 puffs every 6 hours as needed  Prednisone 10 mg tabs  Take 2 tabs daily with food x 5ds, then 1 tab daily with food x 5ds then STOP

## 2016-05-29 ENCOUNTER — Ambulatory Visit
Admission: RE | Admit: 2016-05-29 | Discharge: 2016-05-29 | Disposition: A | Discharge status: Home / Self Care | Payer: PPO | Source: Ambulatory Visit | Attending: Neurosurgery | Admitting: Neurosurgery

## 2016-05-29 DIAGNOSIS — M5126 Other intervertebral disc displacement, lumbar region: Secondary | ICD-10-CM | POA: Diagnosis not present

## 2016-05-29 DIAGNOSIS — M5442 Lumbago with sciatica, left side: Secondary | ICD-10-CM

## 2016-05-29 MED ORDER — IOPAMIDOL (ISOVUE-M 200) INJECTION 41%
1.0000 mL | Freq: Once | INTRAMUSCULAR | Status: AC
  Administered 2016-05-29: 1 mL via EPIDURAL

## 2016-05-29 MED ORDER — METHYLPREDNISOLONE ACETATE 40 MG/ML INJ SUSP (RADIOLOG
120.0000 mg | Freq: Once | INTRAMUSCULAR | Status: AC
  Administered 2016-05-29: 120 mg via EPIDURAL

## 2016-05-29 NOTE — Discharge Instructions (Signed)

## 2016-05-30 ENCOUNTER — Telehealth: Payer: Self-pay | Admitting: Pulmonary Disease

## 2016-05-30 MED ORDER — TIOTROPIUM BROMIDE-OLODATEROL 2.5-2.5 MCG/ACT IN AERS: 2.0000 | INHALATION_SPRAY | Freq: Every day | RESPIRATORY_TRACT | 3 refills | Status: DC

## 2016-05-30 MED ORDER — ALBUTEROL SULFATE HFA 108 (90 BASE) MCG/ACT IN AERS: 2.0000 | INHALATION_SPRAY | Freq: Four times a day (QID) | RESPIRATORY_TRACT | 3 refills | Status: DC | PRN

## 2016-05-30 NOTE — Telephone Encounter (Signed)
Called and spoke with pts family member and she stated that she was the one that called this morning and stated that the pt never did get his medication from his mail order pharmacy.  These have been resent and family is aware.

## 2016-06-03 DIAGNOSIS — Z1389 Encounter for screening for other disorder: Secondary | ICD-10-CM | POA: Diagnosis not present

## 2016-06-03 DIAGNOSIS — D692 Other nonthrombocytopenic purpura: Secondary | ICD-10-CM | POA: Diagnosis not present

## 2016-06-03 DIAGNOSIS — J449 Chronic obstructive pulmonary disease, unspecified: Secondary | ICD-10-CM | POA: Diagnosis not present

## 2016-06-03 DIAGNOSIS — I2510 Atherosclerotic heart disease of native coronary artery without angina pectoris: Secondary | ICD-10-CM | POA: Diagnosis not present

## 2016-06-03 DIAGNOSIS — E7800 Pure hypercholesterolemia, unspecified: Secondary | ICD-10-CM | POA: Diagnosis not present

## 2016-06-03 DIAGNOSIS — Z23 Encounter for immunization: Secondary | ICD-10-CM | POA: Diagnosis not present

## 2016-09-23 ENCOUNTER — Encounter: Payer: Self-pay | Admitting: Pulmonary Disease

## 2016-09-23 ENCOUNTER — Ambulatory Visit (INDEPENDENT_AMBULATORY_CARE_PROVIDER_SITE_OTHER): Payer: PPO | Admitting: Pulmonary Disease

## 2016-09-23 DIAGNOSIS — R0602 Shortness of breath: Secondary | ICD-10-CM

## 2016-09-23 DIAGNOSIS — J439 Emphysema, unspecified: Secondary | ICD-10-CM | POA: Diagnosis not present

## 2016-09-23 MED ORDER — FLUTICASONE-UMECLIDIN-VILANT 100-62.5-25 MCG/INH IN AEPB: 1.0000 | INHALATION_SPRAY | Freq: Every day | RESPIRATORY_TRACT | 0 refills | Status: DC

## 2016-09-23 NOTE — Progress Notes (Signed)
   Subjective:    Patient ID: Jorge [name], male    DOB: 01-Oct-1940, 76 y.o.   MRN: [id number]  HPI   75/M , heavy ex-smoker with CAD for FU of COPD  FEV1 38% in 2008  Adm 10/2009 for Right upper lobe MRSA pneumonia complicated by rt pneumothorax &persistent air leak  Quit smoking since feb '11.   .09/23/2016  Chief Complaint  Patient presents with  . Follow-up    4 month f/u. Breathing has been about the same since last visit. Increased SOB.    His breathing remains patent and he is always out of breath He is compliant with stiolto but this is just like the rest of the medications and don't work after a while. He complains of perennial nasal congestion, tried over-the-counter nasal decongestant spray with minimal relief. He always has dryness of mouth  He refused spirometry today  Difficult family situation, son and 2 daughters have addiction issues  Significant tests/ events  CT chest 01/19/14 - stable Chronic post infectious changes throughout the lungs bilaterally, LUL ground glass opacity, stable compared to 08/2013   CT chest 05/2015 stable COPD changes and nodules.     12/2015 cardiac stress test - w/ EF at 40%. No evidence of ischemia .      Review of Systems neg for any significant sore throat, dysphagia, itching, sneezing, nasal congestion or excess/ purulent secretions, fever, chills, sweats, unintended wt loss, pleuritic or exertional cp, hempoptysis, orthopnea pnd or change in chronic leg swelling. Also denies presyncope, palpitations, heartburn, abdominal pain, nausea, vomiting, diarrhea or change in bowel or urinary habits, dysuria,hematuria, rash, arthralgias, visual complaints, headache, numbness weakness or ataxia.     Objective:   Physical Exam Gen. Pleasant, well-nourished, in no distress ENT - no lesions, no post nasal drip Neck: No JVD, no thyromegaly, no carotid bruits Lungs: no use of accessory muscles, no dullness to percussion,  Decreased bilateral without rales or rhonchi  Cardiovascular: Rhythm regular, heart sounds  normal, no murmurs or gallops, no peripheral edema Musculoskeletal: No deformities, no cyanosis or clubbing         Assessment & Plan:

## 2016-09-23 NOTE — Assessment & Plan Note (Signed)
Persistent dyspnea in spite of maximal bronchodilator therapy-advised him pulmonary rehabilitation but he does not want to do this

## 2016-09-23 NOTE — Addendum Note (Signed)
Addended by: Maurene Capes on: 09/23/2016 03:36 PM   Modules accepted: Orders

## 2016-09-23 NOTE — Assessment & Plan Note (Signed)
Trial of Trelegy - take instead of stiolto Call for prescription if this works 

## 2016-09-23 NOTE — Patient Instructions (Signed)
Trial of Trelegy - take instead of stiolto Call for prescription if this works

## 2016-11-11 DIAGNOSIS — J449 Chronic obstructive pulmonary disease, unspecified: Secondary | ICD-10-CM | POA: Diagnosis not present

## 2016-11-11 DIAGNOSIS — H6691 Otitis media, unspecified, right ear: Secondary | ICD-10-CM | POA: Diagnosis not present

## 2016-11-11 DIAGNOSIS — J209 Acute bronchitis, unspecified: Secondary | ICD-10-CM | POA: Diagnosis not present

## 2016-11-11 DIAGNOSIS — J9801 Acute bronchospasm: Secondary | ICD-10-CM | POA: Diagnosis not present

## 2016-11-17 DIAGNOSIS — E7800 Pure hypercholesterolemia, unspecified: Secondary | ICD-10-CM | POA: Diagnosis not present

## 2017-01-08 ENCOUNTER — Ambulatory Visit (INDEPENDENT_AMBULATORY_CARE_PROVIDER_SITE_OTHER)
Admission: RE | Admit: 2017-01-08 | Discharge: 2017-01-08 | Disposition: A | Discharge status: Home / Self Care | Payer: PPO | Source: Ambulatory Visit | Attending: Adult Health | Admitting: Adult Health

## 2017-01-08 ENCOUNTER — Encounter: Payer: Self-pay | Admitting: Adult Health

## 2017-01-08 ENCOUNTER — Ambulatory Visit (INDEPENDENT_AMBULATORY_CARE_PROVIDER_SITE_OTHER): Payer: PPO | Admitting: Adult Health

## 2017-01-08 DIAGNOSIS — J439 Emphysema, unspecified: Secondary | ICD-10-CM | POA: Diagnosis not present

## 2017-01-08 DIAGNOSIS — R05 Cough: Secondary | ICD-10-CM | POA: Diagnosis not present

## 2017-01-08 MED ORDER — PREDNISONE 10 MG PO TABS: ORAL_TABLET | ORAL | 0 refills | Status: DC

## 2017-01-08 MED ORDER — DOXYCYCLINE HYCLATE 100 MG PO TABS: 100.0000 mg | ORAL_TABLET | Freq: Two times a day (BID) | ORAL | 0 refills | Status: DC

## 2017-01-08 MED ORDER — ALBUTEROL SULFATE (2.5 MG/3ML) 0.083% IN NEBU: 2.5000 mg | INHALATION_SOLUTION | Freq: Four times a day (QID) | RESPIRATORY_TRACT | 5 refills | Status: DC | PRN

## 2017-01-08 NOTE — Addendum Note (Signed)
Addended by: Boone Master E on: 01/08/2017 12:45 PM   Modules accepted: Orders

## 2017-01-08 NOTE — Patient Instructions (Signed)
Doxycycline 100mg  Twice daily  For 7days -take with food.  Prednisone taper over next week.  Mucinex DM Twice daily  As needed  Cough/congestion  Chest xray today  Follow up Dr. Vassie Loll  In 4 months and As needed   Please contact office for sooner follow up if symptoms do not improve or worsen or seek emergency care

## 2017-01-08 NOTE — Progress Notes (Signed)
@Patient  ID: Jorge [name], male    DOB: 1941/06/21, 76 y.o.   MRN: [id number]  Chief Complaint  Patient presents with  . Acute Visit    COPD     Referring provider: Blair Heys, MD  HPI: 76 year old male former smoker followed for COPD Hospital admit 2011 with a right upper lobe MRSA pneumonia compensated by right pneumothorax  TEST  CT chest 01/19/14 - stable Chronic post infectious changes throughout the lungs bilaterally, LUL ground glass opacity, stable compared to 08/2013 CT chest 05/2015 stable COPD changes and nodules.  12/2015 cardiac stress test -w/ EF at 40%. No evidence of ischemia .   01/08/2017 Acute OV : COPD  Patient presents for an acute office visit. Patient complains of 2 weeks of increased cough , congestion , thick mucus -"like glue" , wheezing and increased sob.  Denies chest pain, orthopnea, edema , hemoptysis , fever or n/vd. Appetite is good.  Remains on Stiolto daily.   Allergies  Allergen Reactions  . Adenosine Other (See Comments)    Bronchospasms   . Codeine Other (See Comments)    nervousness, ornery  . Erythromycin Nausea Only    Upset stomach  . Sudafed [Pseudoephedrine] Other (See Comments)    Urinary Sx  . Sulfa Antibiotics Rash    Immunization History  Administered Date(s) Administered  . Influenza Split 05/29/2014  . Influenza Whole 05/12/2007, 05/12/2011  . Influenza, High Dose Seasonal PF 09/11/2016  . Influenza,inj,Quad PF,36+ Mos 04/25/2013, 05/29/2015  . Influenza-Unspecified 05/11/2012  . Pneumococcal Conjugate-13 08/23/2013  . Pneumococcal Polysaccharide-23 05/12/2007    Past Medical History:  Diagnosis Date  . BPH (benign prostatic hypertrophy)   . CAD (coronary artery disease) 2003   s/p stent placement ( stent to LAD/D1 and cutting balloon to LCirc marginal)  . Colon polyp   . COPD (chronic obstructive pulmonary disease) (HCC)   . Hypercholesteremia   . Hypertension   . MRSA (methicillin resistant  staphylococcus aureus) pneumonia (HCC) 10/2009  . PVC's (premature ventricular contractions)     Tobacco History: History  Smoking Status  . Former Smoker  . Packs/day: 1.00  . Years: 50.00  . Types: Cigarettes  . Quit date: 10/09/2009  Smokeless Tobacco  . Never Used   Counseling given: Not Answered   Outpatient Encounter Prescriptions as of 01/08/2017  Medication Sig  . albuterol (PROVENTIL HFA;VENTOLIN HFA) 108 (90 Base) MCG/ACT inhaler Inhale 2 puffs into the lungs every 6 (six) hours as needed for wheezing or shortness of breath.  Marland Kitchen albuterol (PROVENTIL) (2.5 MG/3ML) 0.083% nebulizer solution Take 2.5 mg by nebulization every 6 (six) hours as needed for wheezing or shortness of breath. Reported on 10/02/2015  . aspirin 81 MG tablet Take 81 mg by mouth daily.    . simvastatin (ZOCOR) 80 MG tablet Take 1 tablet by mouth at bedtime.  . Tiotropium Bromide-Olodaterol (STIOLTO RESPIMAT) 2.5-2.5 MCG/ACT AERS Inhale 2 puffs into the lungs daily.  Marland Kitchen doxycycline (VIBRA-TABS) 100 MG tablet Take 1 tablet (100 mg total) by mouth 2 (two) times daily.  . predniSONE (DELTASONE) 10 MG tablet 4 tabs for 2 days, then 3 tabs for 2 days, 2 tabs for 2 days, then 1 tab for 2 days, then stop  . [DISCONTINUED] Fluticasone-Umeclidin-Vilant (TRELEGY ELLIPTA) 100-62.5-25 MCG/INH AEPB Inhale 1 puff into the lungs daily. (Patient not taking: Reported on 01/08/2017)   No facility-administered encounter medications on file as of 01/08/2017.      Review of Systems  Constitutional:   No  weight loss, night sweats,  Fevers, chills, fatigue, or  lassitude.  HEENT:   No headaches,  Difficulty swallowing,  Tooth/dental problems, or  Sore throat,                No sneezing, itching, ear ache, nasal congestion, post nasal drip,   CV:  No chest pain,  Orthopnea, PND, swelling in lower extremities, anasarca, dizziness, palpitations, syncope.   GI  No heartburn, indigestion, abdominal pain, nausea, vomiting, diarrhea,  change in bowel habits, loss of appetite, bloody stools.   Resp:   No chest wall deformity  Skin: no rash or lesions.  GU: no dysuria, change in color of urine, no urgency or frequency.  No flank pain, no hematuria   MS:  No joint pain or swelling.  No decreased range of motion.  No back pain.    Physical Exam  BP 112/66 (BP Location: Left Arm, Cuff Size: Normal)   Pulse 97   Ht 5\' 6"  (1.676 m)   Wt 134 lb 9.6 oz (61.1 kg)   SpO2 97%   BMI 21.73 kg/m   GEN: A/Ox3; pleasant , NAD, elderly    HEENT:  [place]/AT,  EACs-clear, TMs-wnl, NOSE-clear, THROAT-clear, no lesions, no postnasal drip or exudate noted.   NECK:  Supple w/ fair ROM; no JVD; normal carotid impulses w/o bruits; no thyromegaly or nodules palpated; no lymphadenopathy.    RESP  Few trace exp wheezing  W/ no accessory muscle use, no dullness to percussion  CARD:  RRR, no m/r/g, no peripheral edema, pulses intact, no cyanosis or clubbing.  GI:   Soft & nt; nml bowel sounds; no organomegaly or masses detected.   Musco: Warm bil, no deformities or joint swelling noted.   Neuro: alert, no focal deficits noted.    Skin: Warm, no lesions or rashes    Lab Results:   BNP  Imaging: No results found.   Assessment & Plan:   COPD (chronic obstructive pulmonary disease) (HCC) Exacerbation  Check cxr   Plan  Patient Instructions  Doxycycline 100mg  Twice daily  For 7days -take with food.  Prednisone taper over next week.  Mucinex DM Twice daily  As needed  Cough/congestion  Chest xray today  Follow up Dr. Vassie Loll  In 4 months and As needed   Please contact office for sooner follow up if symptoms do not improve or worsen or seek emergency care         Rubye Oaks, NP 01/08/2017

## 2017-01-08 NOTE — Progress Notes (Signed)
Reviewed & agree with plan  

## 2017-01-08 NOTE — Assessment & Plan Note (Signed)
Exacerbation  Check cxr   Plan  Patient Instructions  Doxycycline 100mg  Twice daily  For 7days -take with food.  Prednisone taper over next week.  Mucinex DM Twice daily  As needed  Cough/congestion  Chest xray today  Follow up Dr. Vassie Loll  In 4 months and As needed   Please contact office for sooner follow up if symptoms do not improve or worsen or seek emergency care

## 2017-01-12 ENCOUNTER — Telehealth: Payer: Self-pay | Admitting: Pulmonary Disease

## 2017-01-12 NOTE — Telephone Encounter (Signed)
Spoke with patient regarding CXR results with TP. Patient verbalized understanding. Nothing further needed.

## 2017-01-22 ENCOUNTER — Ambulatory Visit: Payer: PPO | Admitting: Adult Health

## 2017-01-29 ENCOUNTER — Emergency Department (HOSPITAL_COMMUNITY): Payer: PPO

## 2017-01-29 ENCOUNTER — Inpatient Hospital Stay (HOSPITAL_COMMUNITY)
Admission: EM | Admit: 2017-01-29 | Discharge: 2017-02-01 | DRG: 192 | Disposition: A | Discharge status: Home / Self Care | Payer: PPO | Attending: Internal Medicine | Admitting: Internal Medicine

## 2017-01-29 ENCOUNTER — Encounter (HOSPITAL_COMMUNITY): Payer: Self-pay | Admitting: Emergency Medicine

## 2017-01-29 DIAGNOSIS — E785 Hyperlipidemia, unspecified: Secondary | ICD-10-CM | POA: Diagnosis present

## 2017-01-29 DIAGNOSIS — Z882 Allergy status to sulfonamides status: Secondary | ICD-10-CM | POA: Diagnosis not present

## 2017-01-29 DIAGNOSIS — F329 Major depressive disorder, single episode, unspecified: Secondary | ICD-10-CM | POA: Diagnosis present

## 2017-01-29 DIAGNOSIS — Z803 Family history of malignant neoplasm of breast: Secondary | ICD-10-CM | POA: Diagnosis not present

## 2017-01-29 DIAGNOSIS — I493 Ventricular premature depolarization: Secondary | ICD-10-CM | POA: Diagnosis present

## 2017-01-29 DIAGNOSIS — Z885 Allergy status to narcotic agent status: Secondary | ICD-10-CM | POA: Diagnosis not present

## 2017-01-29 DIAGNOSIS — R9431 Abnormal electrocardiogram [ECG] [EKG]: Secondary | ICD-10-CM | POA: Diagnosis not present

## 2017-01-29 DIAGNOSIS — Z87891 Personal history of nicotine dependence: Secondary | ICD-10-CM | POA: Diagnosis not present

## 2017-01-29 DIAGNOSIS — R0603 Acute respiratory distress: Secondary | ICD-10-CM | POA: Diagnosis not present

## 2017-01-29 DIAGNOSIS — J431 Panlobular emphysema: Secondary | ICD-10-CM | POA: Diagnosis not present

## 2017-01-29 DIAGNOSIS — R069 Unspecified abnormalities of breathing: Secondary | ICD-10-CM | POA: Diagnosis not present

## 2017-01-29 DIAGNOSIS — Z825 Family history of asthma and other chronic lower respiratory diseases: Secondary | ICD-10-CM

## 2017-01-29 DIAGNOSIS — J439 Emphysema, unspecified: Principal | ICD-10-CM | POA: Diagnosis present

## 2017-01-29 DIAGNOSIS — F32A Depression, unspecified: Secondary | ICD-10-CM | POA: Diagnosis present

## 2017-01-29 DIAGNOSIS — I10 Essential (primary) hypertension: Secondary | ICD-10-CM

## 2017-01-29 DIAGNOSIS — Z9049 Acquired absence of other specified parts of digestive tract: Secondary | ICD-10-CM | POA: Diagnosis not present

## 2017-01-29 DIAGNOSIS — Z955 Presence of coronary angioplasty implant and graft: Secondary | ICD-10-CM | POA: Diagnosis not present

## 2017-01-29 DIAGNOSIS — Z8249 Family history of ischemic heart disease and other diseases of the circulatory system: Secondary | ICD-10-CM | POA: Diagnosis not present

## 2017-01-29 DIAGNOSIS — Z86010 Personal history of colonic polyps: Secondary | ICD-10-CM

## 2017-01-29 DIAGNOSIS — Z888 Allergy status to other drugs, medicaments and biological substances status: Secondary | ICD-10-CM | POA: Diagnosis not present

## 2017-01-29 DIAGNOSIS — I2510 Atherosclerotic heart disease of native coronary artery without angina pectoris: Secondary | ICD-10-CM | POA: Diagnosis not present

## 2017-01-29 DIAGNOSIS — Z801 Family history of malignant neoplasm of trachea, bronchus and lung: Secondary | ICD-10-CM | POA: Diagnosis not present

## 2017-01-29 DIAGNOSIS — I4581 Long QT syndrome: Secondary | ICD-10-CM | POA: Diagnosis not present

## 2017-01-29 DIAGNOSIS — R0602 Shortness of breath: Secondary | ICD-10-CM | POA: Diagnosis not present

## 2017-01-29 DIAGNOSIS — Z7982 Long term (current) use of aspirin: Secondary | ICD-10-CM

## 2017-01-29 DIAGNOSIS — E7800 Pure hypercholesterolemia, unspecified: Secondary | ICD-10-CM | POA: Diagnosis not present

## 2017-01-29 DIAGNOSIS — J441 Chronic obstructive pulmonary disease with (acute) exacerbation: Secondary | ICD-10-CM | POA: Diagnosis not present

## 2017-01-29 DIAGNOSIS — N400 Enlarged prostate without lower urinary tract symptoms: Secondary | ICD-10-CM | POA: Diagnosis not present

## 2017-01-29 DIAGNOSIS — Z9981 Dependence on supplemental oxygen: Secondary | ICD-10-CM

## 2017-01-29 DIAGNOSIS — J449 Chronic obstructive pulmonary disease, unspecified: Secondary | ICD-10-CM | POA: Diagnosis present

## 2017-01-29 LAB — URINALYSIS, ROUTINE W REFLEX MICROSCOPIC
Bilirubin Urine: NEGATIVE
Glucose, UA: NEGATIVE mg/dL
Hgb urine dipstick: NEGATIVE
Ketones, ur: NEGATIVE mg/dL
Leukocytes, UA: NEGATIVE
Nitrite: NEGATIVE
Protein, ur: NEGATIVE mg/dL
Specific Gravity, Urine: 1.008 (ref 1.005–1.030)
pH: 5 (ref 5.0–8.0)

## 2017-01-29 LAB — CBC WITH DIFFERENTIAL/PLATELET
Basophils Absolute: 0 10*3/uL (ref 0.0–0.1)
Basophils Relative: 0 %
Eosinophils Absolute: 0.3 10*3/uL (ref 0.0–0.7)
Eosinophils Relative: 4 %
HEMATOCRIT: 44.4 % (ref 39.0–52.0)
HEMOGLOBIN: 15 g/dL (ref 13.0–17.0)
LYMPHS ABS: 2.7 10*3/uL (ref 0.7–4.0)
Lymphocytes Relative: 29 %
MCH: 32.1 pg (ref 26.0–34.0)
MCHC: 33.8 g/dL (ref 30.0–36.0)
MCV: 94.9 fL (ref 78.0–100.0)
MONOS PCT: 7 %
Monocytes Absolute: 0.7 10*3/uL (ref 0.1–1.0)
NEUTROS ABS: 5.6 10*3/uL (ref 1.7–7.7)
NEUTROS PCT: 60 %
Platelets: 239 10*3/uL (ref 150–400)
RBC: 4.68 MIL/uL (ref 4.22–5.81)
RDW: 12.8 % (ref 11.5–15.5)
WBC: 9.3 10*3/uL (ref 4.0–10.5)

## 2017-01-29 LAB — I-STAT ARTERIAL BLOOD GAS, ED
Acid-Base Excess: 3 mmol/L — ABNORMAL HIGH (ref 0.0–2.0)
BICARBONATE: 29.5 mmol/L — AB (ref 20.0–28.0)
O2 Saturation: 100 %
PH ART: 7.375 (ref 7.350–7.450)
TCO2: 31 mmol/L (ref 0–100)
pCO2 arterial: 50.1 mmHg — ABNORMAL HIGH (ref 32.0–48.0)
pO2, Arterial: 241 mmHg — ABNORMAL HIGH (ref 83.0–108.0)

## 2017-01-29 LAB — MAGNESIUM: Magnesium: 1.9 mg/dL (ref 1.7–2.4)

## 2017-01-29 LAB — MRSA PCR SCREENING: MRSA by PCR: POSITIVE — AB

## 2017-01-29 LAB — BASIC METABOLIC PANEL
Anion gap: 9 (ref 5–15)
BUN: 8 mg/dL (ref 6–20)
CHLORIDE: 102 mmol/L (ref 101–111)
CO2: 29 mmol/L (ref 22–32)
Calcium: 8.8 mg/dL — ABNORMAL LOW (ref 8.9–10.3)
Creatinine, Ser: 0.95 mg/dL (ref 0.61–1.24)
GFR calc Af Amer: >60 mL/min (ref >60)
GFR calc non Af Amer: >60 mL/min (ref >60)
Glucose, Bld: 163 mg/dL — ABNORMAL HIGH (ref 65–99)
POTASSIUM: 3.9 mmol/L (ref 3.5–5.1)
Sodium: 140 mmol/L (ref 135–145)

## 2017-01-29 LAB — I-STAT TROPONIN, ED: Troponin i, poc: 0 ng/mL (ref 0.00–0.08)

## 2017-01-29 LAB — TROPONIN I
TROPONIN I: 0.06 ng/mL — AB (ref <0.03)
Troponin I: 0.09 ng/mL (ref <0.03)

## 2017-01-29 LAB — BRAIN NATRIURETIC PEPTIDE: B Natriuretic Peptide: 29 pg/mL (ref 0.0–100.0)

## 2017-01-29 LAB — PHOSPHORUS: Phosphorus: 2.5 mg/dL (ref 2.5–4.6)

## 2017-01-29 MED ORDER — IPRATROPIUM-ALBUTEROL 0.5-2.5 (3) MG/3ML IN SOLN
3.0000 mL | RESPIRATORY_TRACT | Status: DC | PRN
  Filled 2017-01-29: qty 3

## 2017-01-29 MED ORDER — CHLORHEXIDINE GLUCONATE CLOTH 2 % EX PADS
6.0000 | MEDICATED_PAD | Freq: Every day | CUTANEOUS | Status: DC
  Administered 2017-01-30 – 2017-02-01 (×3): 6 via TOPICAL

## 2017-01-29 MED ORDER — ATORVASTATIN CALCIUM 40 MG PO TABS
40.0000 mg | ORAL_TABLET | Freq: Every day | ORAL | Status: DC
  Administered 2017-01-29 – 2017-01-31 (×3): 40 mg via ORAL
  Filled 2017-01-29 (×3): qty 1

## 2017-01-29 MED ORDER — ONDANSETRON HCL 4 MG PO TABS: 4.0000 mg | ORAL_TABLET | Freq: Four times a day (QID) | ORAL | Status: DC | PRN

## 2017-01-29 MED ORDER — ALBUTEROL (5 MG/ML) CONTINUOUS INHALATION SOLN
10.0000 mg/h | INHALATION_SOLUTION | Freq: Once | RESPIRATORY_TRACT | Status: AC
  Administered 2017-01-29: 10 mg/h via RESPIRATORY_TRACT
  Filled 2017-01-29: qty 20

## 2017-01-29 MED ORDER — ACETAMINOPHEN 325 MG PO TABS: 650.0000 mg | ORAL_TABLET | Freq: Four times a day (QID) | ORAL | Status: DC | PRN

## 2017-01-29 MED ORDER — IPRATROPIUM BROMIDE 0.02 % IN SOLN
1.0000 mg | Freq: Once | RESPIRATORY_TRACT | Status: AC
  Administered 2017-01-29: 0.5 mg via RESPIRATORY_TRACT
  Filled 2017-01-29: qty 5

## 2017-01-29 MED ORDER — SENNOSIDES-DOCUSATE SODIUM 8.6-50 MG PO TABS: 1.0000 | ORAL_TABLET | Freq: Every evening | ORAL | Status: DC | PRN

## 2017-01-29 MED ORDER — SODIUM CHLORIDE 0.9 % IV SOLN
INTRAVENOUS | Status: DC
  Administered 2017-01-29 – 2017-01-31 (×3): via INTRAVENOUS

## 2017-01-29 MED ORDER — ONDANSETRON HCL 4 MG/2ML IJ SOLN: 4.0000 mg | Freq: Four times a day (QID) | INTRAMUSCULAR | Status: DC | PRN

## 2017-01-29 MED ORDER — GUAIFENESIN-DM 100-10 MG/5ML PO SYRP
5.0000 mL | ORAL_SOLUTION | ORAL | Status: DC | PRN
  Administered 2017-01-29: 5 mL via ORAL
  Filled 2017-01-29: qty 5

## 2017-01-29 MED ORDER — METHYLPREDNISOLONE SODIUM SUCC 125 MG IJ SOLR
60.0000 mg | Freq: Four times a day (QID) | INTRAMUSCULAR | Status: DC
  Administered 2017-01-29 – 2017-01-31 (×8): 60 mg via INTRAVENOUS
  Filled 2017-01-29 (×9): qty 2

## 2017-01-29 MED ORDER — ASPIRIN 81 MG PO CHEW
81.0000 mg | CHEWABLE_TABLET | Freq: Every day | ORAL | Status: DC
  Administered 2017-01-29 – 2017-02-01 (×4): 81 mg via ORAL
  Filled 2017-01-29 (×4): qty 1

## 2017-01-29 MED ORDER — ACETAMINOPHEN 650 MG RE SUPP: 650.0000 mg | Freq: Four times a day (QID) | RECTAL | Status: DC | PRN

## 2017-01-29 MED ORDER — MUPIROCIN 2 % EX OINT
1.0000 "application " | TOPICAL_OINTMENT | Freq: Two times a day (BID) | CUTANEOUS | Status: DC
  Administered 2017-01-29 – 2017-02-01 (×6): 1 via NASAL
  Filled 2017-01-29: qty 22

## 2017-01-29 MED ORDER — DOXYCYCLINE HYCLATE 100 MG IV SOLR
100.0000 mg | Freq: Two times a day (BID) | INTRAVENOUS | Status: DC
  Administered 2017-01-29 – 2017-01-31 (×6): 100 mg via INTRAVENOUS
  Filled 2017-01-29 (×7): qty 100

## 2017-01-29 MED ORDER — ENOXAPARIN SODIUM 40 MG/0.4ML ~~LOC~~ SOLN
40.0000 mg | SUBCUTANEOUS | Status: DC
  Administered 2017-01-29 – 2017-01-31 (×3): 40 mg via SUBCUTANEOUS
  Filled 2017-01-29 (×3): qty 0.4

## 2017-01-29 MED ORDER — HYDRALAZINE HCL 20 MG/ML IJ SOLN: 10.0000 mg | Freq: Three times a day (TID) | INTRAMUSCULAR | Status: DC | PRN

## 2017-01-29 MED ORDER — BISACODYL 10 MG RE SUPP: 10.0000 mg | Freq: Every day | RECTAL | Status: DC | PRN

## 2017-01-29 NOTE — ED Notes (Signed)
Attempted to obtain urine sample and unable to do so. Urinal at bedside.

## 2017-01-29 NOTE — ED Provider Notes (Signed)
TIME SEEN: 5:46 AM  CHIEF COMPLAINT: Shortness of breath  HPI: Patient is a 76 year old male with history of CAD status post stent placement, COPD, hypertension, hyperlipidemia who presents to the emergency department 2 days of worsening shortness of breath. Has been coughing up white/gray sputum. No fevers. Has chronic chest discomfort that is unchanged. No lower extremity swelling or pain. Tried his nebulizer treatments at home without relief. Was given 15 mg of albuterol, 0.5 mg of Atrovent, 125 mg of IV Solu-Medrol with EMS. No documented hypoxia with EMS.  ROS: See HPI Constitutional: no fever  Eyes: no drainage  ENT: no runny nose   Cardiovascular:  Chronic anterior chest pain  Resp:  SOB  GI: no vomiting GU: no dysuria Integumentary: no rash  Allergy: no hives  Musculoskeletal: no leg swelling  Neurological: no slurred speech ROS otherwise negative  PAST MEDICAL HISTORY/PAST SURGICAL HISTORY:  Past Medical History:  Diagnosis Date  . BPH (benign prostatic hypertrophy)   . CAD (coronary artery disease) 2003   s/p stent placement ( stent to LAD/D1 and cutting balloon to LCirc marginal)  . Colon polyp   . COPD (chronic obstructive pulmonary disease) (HCC)   . Hypercholesteremia   . Hypertension   . MRSA (methicillin resistant staphylococcus aureus) pneumonia (HCC) 10/2009  . PVC's (premature ventricular contractions)     MEDICATIONS:  Prior to Admission medications   Medication Sig Start Date End Date Taking? Authorizing Provider  albuterol (PROVENTIL HFA;VENTOLIN HFA) 108 (90 Base) MCG/ACT inhaler Inhale 2 puffs into the lungs every 6 (six) hours as needed for wheezing or shortness of breath. 05/30/16   Oretha Milch, MD  albuterol (PROVENTIL) (2.5 MG/3ML) 0.083% nebulizer solution Take 3 mLs (2.5 mg total) by nebulization every 6 (six) hours as needed for wheezing or shortness of breath. Reported on 10/02/2015 01/08/17   Parrett, Virgel Bouquet, NP  aspirin 81 MG tablet Take 81 mg  by mouth daily.      [provider]  doxycycline (VIBRA-TABS) 100 MG tablet Take 1 tablet (100 mg total) by mouth 2 (two) times daily. 01/08/17   Parrett, Virgel Bouquet, NP  predniSONE (DELTASONE) 10 MG tablet 4 tabs for 2 days, then 3 tabs for 2 days, 2 tabs for 2 days, then 1 tab for 2 days, then stop 01/08/17   Parrett, Tammy S, NP  simvastatin (ZOCOR) 80 MG tablet Take 1 tablet by mouth at bedtime. 05/08/14   [provider]  Tiotropium Bromide-Olodaterol (STIOLTO RESPIMAT) 2.5-2.5 MCG/ACT AERS Inhale 2 puffs into the lungs daily. 05/30/16   Oretha Milch, MD    ALLERGIES:  Allergies  Allergen Reactions  . Adenosine Other (See Comments)    Bronchospasms   . Codeine Other (See Comments)    nervousness, ornery  . Erythromycin Nausea Only    Upset stomach  . Sudafed [Pseudoephedrine] Other (See Comments)    Urinary Sx  . Sulfa Antibiotics Rash    SOCIAL HISTORY:  Social History  Substance Use Topics  . Smoking status: Former Smoker    Packs/day: 1.00    Years: 50.00    Types: Cigarettes    Quit date: 10/09/2009  . Smokeless tobacco: Never Used  . Alcohol use No    FAMILY HISTORY: Family History  Problem Relation Age of Onset  . Asthma Father   . CAD Father   . Breast cancer Mother   . Lung cancer Mother   . Cancer - Lung Mother     EXAM: Ht 5'  6" (1.676 m)   Wt 60.8 kg (134 lb)   SpO2 100%   BMI 21.63 kg/m  CONSTITUTIONAL: Alert and oriented and responds appropriately to questions. Elderly, in no significant distress but does appear short of breath HEAD: Normocephalic EYES: Conjunctivae clear, pupils appear equal, EOMI ENT: normal nose; moist mucous membranes NECK: Supple, no meningismus, no nuchal rigidity, no LAD  CARD: RRR; S1 and S2 appreciated; no murmurs, no clicks, no rubs, no gallops RESP: Patient is tachypneic, currently receiving a breathing treatment, speaking short sentences, has increased work of breathing, diffuse diminished aeration,  scattered expiratory wheezes, no rhonchi or rales ABD/GI: Normal bowel sounds; non-distended; soft, non-tender, no rebound, no guarding, no peritoneal signs, no hepatosplenomegaly BACK:  The back appears normal and is non-tender to palpation, there is no CVA tenderness EXT: Normal ROM in all joints; non-tender to palpation; no edema; normal capillary refill; no cyanosis, no calf tenderness or swelling    SKIN: Normal color for age and race; warm; no rash NEURO: Moves all extremities equally PSYCH: The patient's mood and manner are appropriate. Grooming and personal hygiene are appropriate.  MEDICAL DECISION MAKING: Patient here with what is likely a COPD exacerbation. EKG shows sinus tachycardia without obvious ischemic abnormality. We'll obtain labs, chest x-ray and ABG. Will continue albuterol, Atrovent. If patient does not improve, he will likely need admission.  ED PROGRESS: 7:30 AM  Patient's respiratory rate has improved but he is still wheezing, tight on exam. Heart rate is now in the 120s. Chest x-ray is clear and shows changes consistent with COPD and scarring in the right upper lobe that is chronic. His ABG shows a PCO2 of 50.1 but elevated bicarbonate as he appears to be compensated. His troponin is negative. BNP is normal. I feel he will need admission for COPD exacerbation. His PCP is with [facility] physicians.   7:51 AM Discussed patient's case with hospitalist team.  I have recommended admission and patient (and family if present) agree with this plan. Admitting physician will place admission orders.   I reviewed all nursing notes, vitals, pertinent previous records, EKGs, lab and urine results, imaging (as available).     EKG Interpretation  Date/Time:  Thursday January 29 2017 05:45:00 EDT Ventricular Rate:  117 PR Interval:    QRS Duration: 70 QT Interval:  369 QTC Calculation: 515 R Axis:   46 Text Interpretation:  Sinus tachycardia Biatrial enlargement Anteroseptal infarct,  age indeterminate Borderline repolarization abnormality Prolonged QT interval Confirmed by Rochele Raring [phone]) on 01/29/2017 5:46:54 AM          Kamille Toomey, Layla Maw, DO 01/29/17 [id number]

## 2017-01-29 NOTE — Progress Notes (Signed)
Patient stable since admit to [facility], maintaining oxygen saturation on room air, up and down to restroom with worsening dyspnea but no respiratory distress.  Did inform patient that his MRSA swab was positive and discussed initiation of contact precautions.  Patient verbalizes understanding.

## 2017-01-29 NOTE — ED Notes (Signed)
Respiratory at bedside.

## 2017-01-29 NOTE — Progress Notes (Signed)
CRITICAL VALUE ALERT  Critical Value:  Troponin 0.09   Date & Time Notied:  01/29/17 1645   Provider Notified: Dr. Melynda Ripple   Orders Received/Actions taken: Cylical troponins ordered

## 2017-01-29 NOTE — H&P (Signed)
History and Physical    Jorge [name] ZOX:[id number] DOB: 09-29-1940 DOA: 01/29/2017   PCP: Blair Heys, MD   Patient coming from:  Home    Chief Complaint: Shortness of breath   HPI: Jorge [name] is a 76 y.o. male with medical history significant for CAD status post stent placement, COPD - emphysema, followed as outpatient by pulmonary, hypertension, hyperlipidemia, presenting to the ED with 2 day history of worsening shortness of breath. The patient has been coughing white and gray sputum, but denies any fever or chills. He denies any chest pain or palpitations or pleuritic chest pain. The patient has chronic chest discomfort however, which is unchanged. Of note, he reports sick contact on his brother, who was recently diagnosed with pneumonia.Deneis recent long distance trips. .Denies tobacco  No lower extremity swelling or pain. The patient tried nebulizer treatments at home without relief.  Denies rhinorrhea or hemoptysis. Denies any abdominal pain. Has decreased appetite due to current symptoms.  No confusion was reported. Denies any vision changes, double vision or headaches.   ED Course:  BP 115/60   Pulse (!) 115   Temp 97.5 F (36.4 C) (Oral)   Resp (!) 27   Ht 5\' 6"  (1.676 m)   Wt 60.8 kg (134 lb)   SpO2 95%   BMI 21.63 kg/m    On transport, the patient received albuterol, Atrovent nebulizers, as well as IV 125 mg a Solu-Medrol, with improvement of his symptoms. Of note, there is no documented hypoxia with EMS. Chest x-ray negative for acute changes. EKG without ACS. Troponin negative. Sodium 140 potassium 3.9 bicarb 29 glucose 163 creatinine 0.95 BNP 29 white count 9.3 hemoglobin 15 platelets 239  Review of Systems:  As per HPI otherwise all other systems reviewed and are negative  Past Medical History:  Diagnosis Date  . BPH (benign prostatic hypertrophy)   . CAD (coronary artery disease) 2003   s/p stent placement ( stent to LAD/D1 and cutting balloon to  LCirc marginal)  . Colon polyp   . COPD (chronic obstructive pulmonary disease) (HCC)   . Hypercholesteremia   . Hypertension   . MRSA (methicillin resistant staphylococcus aureus) pneumonia (HCC) 10/2009  . PVC's (premature ventricular contractions)     Past Surgical History:  Procedure Laterality Date  . CHOLECYSTECTOMY    . CORONARY ANGIOPLASTY      Social History Social History   Social History  . Marital status: Widowed    Spouse name: N/A  . Number of children: N/A  . Years of education: N/A   Occupational History  . Facilities manager at [employer]    Social History Main Topics  . Smoking status: Former Smoker    Packs/day: 1.00    Years: 50.00    Types: Cigarettes    Quit date: 10/09/2009  . Smokeless tobacco: Never Used  . Alcohol use No  . Drug use: No  . Sexual activity: Not on file   Other Topics Concern  . Not on file   Social History Narrative  . No narrative on file     Allergies  Allergen Reactions  . Adenosine Other (See Comments)    Bronchospasms   . Codeine Other (See Comments)    nervousness, ornery  . Erythromycin Nausea Only    Upset stomach  . Sudafed [Pseudoephedrine] Other (See Comments)    Urinary Sx  . Sulfa Antibiotics Rash    Family History  Problem Relation Age of Onset  . Asthma  Father   . CAD Father   . Breast cancer Mother   . Lung cancer Mother   . Cancer - Lung Mother       Prior to Admission medications   Medication Sig Start Date End Date Taking? Authorizing Provider  albuterol (PROVENTIL HFA;VENTOLIN HFA) 108 (90 Base) MCG/ACT inhaler Inhale 2 puffs into the lungs every 6 (six) hours as needed for wheezing or shortness of breath. 05/30/16  Yes Oretha Milch, MD  albuterol (PROVENTIL) (2.5 MG/3ML) 0.083% nebulizer solution Take 3 mLs (2.5 mg total) by nebulization every 6 (six) hours as needed for wheezing or shortness of breath. Reported on 10/02/2015 01/08/17  Yes Parrett, Tammy S, NP  aspirin 81 MG  tablet Take 81 mg by mouth daily.     Yes [provider]  simvastatin (ZOCOR) 80 MG tablet Take 40 mg by mouth at bedtime.  05/08/14  Yes [provider]  Tiotropium Bromide-Olodaterol (STIOLTO RESPIMAT) 2.5-2.5 MCG/ACT AERS Inhale 2 puffs into the lungs daily. 05/30/16  Yes Oretha Milch, MD    Physical Exam:  Vitals:   01/29/17 0815 01/29/17 0830 01/29/17 0845 01/29/17 0900  BP: 105/66 113/61 (!) 108/59 115/60  Pulse: (!) 112 (!) 114 (!) 113 (!) 115  Resp: (!) 26 (!) 25 (!) 22 (!) 27  Temp:      TempSrc:      SpO2: 93% 93% 93% 95%  Weight:      Height:       Constitutional: NAD, calm, comfortable , ill appearing  Eyes: PERRL, lids and conjunctivae normal ENMT: Mucous membranes are moist, without exudate or lesions  Neck: normal, supple, no masses, no thyromegaly Respiratory:  no wheezing, no crackles. Atelectatic sounds noted  Normal respiratory effort  Cardiovascular: Regular rate and rhythm, no murmurs, rubs or gallops. No extremity edema. 2+ pedal pulses. No carotid bruits.  Abdomen: Soft, non tender, No hepatosplenomegaly. Bowel sounds positive.  Musculoskeletal: no clubbing / cyanosis. Moves all extremities Skin: no jaundice, No lesions.  Neurologic: Sensation intact  Strength equal in all extremities Psychiatric:   Alert and oriented x 3. Normal mood.     Labs on Admission: I have personally reviewed following labs and imaging studies  CBC:  Recent Labs Lab 01/29/17 0557  WBC 9.3  NEUTROABS 5.6  HGB 15.0  HCT 44.4  MCV 94.9  PLT 239    Basic Metabolic Panel:  Recent Labs Lab 01/29/17 0557  NA 140  K 3.9  CL 102  CO2 29  GLUCOSE 163*  BUN 8  CREATININE 0.95  CALCIUM 8.8*    GFR: Estimated Creatinine Clearance: 56 mL/min (by C-G formula based on SCr of 0.95 mg/dL).  Liver Function Tests: No results for input(s): AST, ALT, ALKPHOS, BILITOT, PROT, ALBUMIN in the last 168 hours. No results for input(s): LIPASE, AMYLASE in the  last 168 hours. No results for input(s): AMMONIA in the last 168 hours.  Coagulation Profile: No results for input(s): INR, PROTIME in the last 168 hours.  Cardiac Enzymes: No results for input(s): CKTOTAL, CKMB, CKMBINDEX, TROPONINI in the last 168 hours.  BNP (last 3 results) No results for input(s): PROBNP in the last 8760 hours.  HbA1C: No results for input(s): HGBA1C in the last 72 hours.  CBG: No results for input(s): GLUCAP in the last 168 hours.  Lipid Profile: No results for input(s): CHOL, HDL, LDLCALC, TRIG, CHOLHDL, LDLDIRECT in the last 72 hours.  Thyroid Function Tests: No results for input(s): TSH, T4TOTAL, FREET4,  T3FREE, THYROIDAB in the last 72 hours.  Anemia Panel: No results for input(s): VITAMINB12, FOLATE, FERRITIN, TIBC, IRON, RETICCTPCT in the last 72 hours.  Urine analysis:    Component Value Date/Time   COLORURINE YELLOW 10/30/2009 1855   APPEARANCEUR CLEAR 10/30/2009 1855   LABSPEC 1.017 10/30/2009 1855   PHURINE 7.0 10/30/2009 1855   GLUCOSEU NEGATIVE 10/30/2009 1855   HGBUR NEGATIVE 10/30/2009 1855   BILIRUBINUR NEGATIVE 10/30/2009 1855   KETONESUR NEGATIVE 10/30/2009 1855   PROTEINUR NEGATIVE 10/30/2009 1855   UROBILINOGEN 0.2 10/30/2009 1855   NITRITE NEGATIVE 10/30/2009 1855   LEUKOCYTESUR  10/30/2009 1855    NEGATIVE MICROSCOPIC NOT DONE ON URINES WITH NEGATIVE PROTEIN, BLOOD, LEUKOCYTES, NITRITE, OR GLUCOSE <1000 mg/dL.    Sepsis Labs: @LABRCNTIP (procalcitonin:4,lacticidven:4) )No results found for this or any previous visit (from the past 240 hour(s)).   Radiological Exams on Admission: Dg Chest Portable 1 View  Result Date: 01/29/2017 CLINICAL DATA:  Shortness of breath. EXAM: PORTABLE CHEST 1 VIEW COMPARISON:  01/08/2017.  08/23/2013. FINDINGS: Mediastinum and hilar structures are normal. Coronary stents noted. Heart size normal. COPD. Right apical and bibasal pleuroparenchymal thickening consistent with scarring again noted. No  acute bony abnormality. Prior cervicothoracic spine fusion. Degenerative changes thoracic spine. Surgical clips right upper quadrant. IMPRESSION: 1. Right apical lung bibasal pleuroparenchymal thickening consistent with scarring. 2. COPD.  No acute abnormality. Electronically Signed   By: Maisie Fus  Register   On: 01/29/2017 06:13    EKG: Independently reviewed.  Assessment/Plan Active Problems:   COPD exacerbation (HCC)   OXYGEN-USE OF SUPPLEMENTAL   Depression   Coronary atherosclerosis of native coronary artery   Pure hypercholesterolemia   PVC's (premature ventricular contractions)   COPD (chronic obstructive pulmonary disease) (HCC)   SOB (shortness of breath)    Acute respiratory distress without hypoxia  likely secondary to acute on chronic  COPD exacerbation CXR without infiltrates   WBC normal   Afebrile  VSS  Initial ABG was PCO2 50 with elevated Bicarb, compensated  Currently on O2 at  2L [place]   Admit to tele obs  Doxycycline bid  Duonebs   Mucinex O2 prn CBC in am Incentive spirometry CXR in am   Hyperlipidemia Continue home statins   DVT prophylaxis: Lovenox     Code Status:   Full    Family Communication:  Discussed with patient Disposition Plan: Expect patient to be discharged to home after condition improves Consults called:  None    Admission status:Tele Obs   Reese Stockman E, PA-C [facility]   01/29/2017, 9:35 AM

## 2017-01-29 NOTE — ED Notes (Signed)
EDP made aware of increased heart rate and increased work of breathing still present.

## 2017-01-29 NOTE — ED Triage Notes (Signed)
Pt has had SHOB for two days that increased this morning and did not get relief from home Nebs. EMS gave 125 solumedrol, 15mg  albuterol, 0.5 atrovent.

## 2017-01-29 NOTE — Progress Notes (Signed)
This note also relates to the following rows which could not be included: SpO2 - Cannot attach notes to unvalidated device data  RT instructed pt and family on using incentive spirometer.  Pt able to reach 1750 mL

## 2017-01-30 ENCOUNTER — Observation Stay (HOSPITAL_COMMUNITY): Payer: PPO

## 2017-01-30 DIAGNOSIS — Z8249 Family history of ischemic heart disease and other diseases of the circulatory system: Secondary | ICD-10-CM | POA: Diagnosis not present

## 2017-01-30 DIAGNOSIS — I2510 Atherosclerotic heart disease of native coronary artery without angina pectoris: Secondary | ICD-10-CM | POA: Diagnosis not present

## 2017-01-30 DIAGNOSIS — Z9049 Acquired absence of other specified parts of digestive tract: Secondary | ICD-10-CM | POA: Diagnosis not present

## 2017-01-30 DIAGNOSIS — Z882 Allergy status to sulfonamides status: Secondary | ICD-10-CM | POA: Diagnosis not present

## 2017-01-30 DIAGNOSIS — I360 Nonrheumatic tricuspid (valve) stenosis: Secondary | ICD-10-CM

## 2017-01-30 DIAGNOSIS — J439 Emphysema, unspecified: Secondary | ICD-10-CM | POA: Diagnosis not present

## 2017-01-30 DIAGNOSIS — Z885 Allergy status to narcotic agent status: Secondary | ICD-10-CM | POA: Diagnosis not present

## 2017-01-30 DIAGNOSIS — Z87891 Personal history of nicotine dependence: Secondary | ICD-10-CM | POA: Diagnosis not present

## 2017-01-30 DIAGNOSIS — J441 Chronic obstructive pulmonary disease with (acute) exacerbation: Secondary | ICD-10-CM | POA: Diagnosis not present

## 2017-01-30 DIAGNOSIS — Z7982 Long term (current) use of aspirin: Secondary | ICD-10-CM | POA: Diagnosis not present

## 2017-01-30 DIAGNOSIS — Z801 Family history of malignant neoplasm of trachea, bronchus and lung: Secondary | ICD-10-CM | POA: Diagnosis not present

## 2017-01-30 DIAGNOSIS — Z803 Family history of malignant neoplasm of breast: Secondary | ICD-10-CM | POA: Diagnosis not present

## 2017-01-30 DIAGNOSIS — N400 Enlarged prostate without lower urinary tract symptoms: Secondary | ICD-10-CM | POA: Diagnosis not present

## 2017-01-30 DIAGNOSIS — Z825 Family history of asthma and other chronic lower respiratory diseases: Secondary | ICD-10-CM | POA: Diagnosis not present

## 2017-01-30 DIAGNOSIS — R0603 Acute respiratory distress: Secondary | ICD-10-CM | POA: Diagnosis not present

## 2017-01-30 DIAGNOSIS — E785 Hyperlipidemia, unspecified: Secondary | ICD-10-CM | POA: Diagnosis not present

## 2017-01-30 DIAGNOSIS — J431 Panlobular emphysema: Secondary | ICD-10-CM | POA: Diagnosis not present

## 2017-01-30 DIAGNOSIS — E7800 Pure hypercholesterolemia, unspecified: Secondary | ICD-10-CM | POA: Diagnosis not present

## 2017-01-30 DIAGNOSIS — Z888 Allergy status to other drugs, medicaments and biological substances status: Secondary | ICD-10-CM | POA: Diagnosis not present

## 2017-01-30 DIAGNOSIS — I4581 Long QT syndrome: Secondary | ICD-10-CM | POA: Diagnosis not present

## 2017-01-30 DIAGNOSIS — I10 Essential (primary) hypertension: Secondary | ICD-10-CM | POA: Diagnosis not present

## 2017-01-30 DIAGNOSIS — Z955 Presence of coronary angioplasty implant and graft: Secondary | ICD-10-CM | POA: Diagnosis not present

## 2017-01-30 DIAGNOSIS — J449 Chronic obstructive pulmonary disease, unspecified: Secondary | ICD-10-CM | POA: Diagnosis not present

## 2017-01-30 DIAGNOSIS — Z86010 Personal history of colonic polyps: Secondary | ICD-10-CM | POA: Diagnosis not present

## 2017-01-30 LAB — COMPREHENSIVE METABOLIC PANEL
ALBUMIN: 3.3 g/dL — AB (ref 3.5–5.0)
ALT: 17 U/L (ref 17–63)
AST: 21 U/L (ref 15–41)
Alkaline Phosphatase: 83 U/L (ref 38–126)
Anion gap: 7 (ref 5–15)
BUN: 10 mg/dL (ref 6–20)
CHLORIDE: 107 mmol/L (ref 101–111)
CO2: 25 mmol/L (ref 22–32)
CREATININE: 0.89 mg/dL (ref 0.61–1.24)
Calcium: 8.8 mg/dL — ABNORMAL LOW (ref 8.9–10.3)
GFR calc Af Amer: >60 mL/min (ref >60)
GFR calc non Af Amer: >60 mL/min (ref >60)
GLUCOSE: 157 mg/dL — AB (ref 65–99)
POTASSIUM: 4.1 mmol/L (ref 3.5–5.1)
Sodium: 139 mmol/L (ref 135–145)
Total Bilirubin: 0.8 mg/dL (ref 0.3–1.2)
Total Protein: 6.3 g/dL — ABNORMAL LOW (ref 6.5–8.1)

## 2017-01-30 LAB — CBC
HEMATOCRIT: 41.1 % (ref 39.0–52.0)
Hemoglobin: 14 g/dL (ref 13.0–17.0)
MCH: 31.9 pg (ref 26.0–34.0)
MCHC: 34.1 g/dL (ref 30.0–36.0)
MCV: 93.6 fL (ref 78.0–100.0)
Platelets: 246 10*3/uL (ref 150–400)
RBC: 4.39 MIL/uL (ref 4.22–5.81)
RDW: 12.8 % (ref 11.5–15.5)
WBC: 11.3 10*3/uL — ABNORMAL HIGH (ref 4.0–10.5)

## 2017-01-30 LAB — TROPONIN I
Troponin I: 0.04 ng/mL (ref <0.03)
Troponin I: 0.04 ng/mL (ref <0.03)

## 2017-01-30 MED ORDER — IPRATROPIUM-ALBUTEROL 0.5-2.5 (3) MG/3ML IN SOLN: 3.0000 mL | Freq: Three times a day (TID) | RESPIRATORY_TRACT | Status: DC

## 2017-01-30 MED ORDER — GUAIFENESIN ER 600 MG PO TB12
600.0000 mg | ORAL_TABLET | Freq: Two times a day (BID) | ORAL | Status: DC
  Administered 2017-01-30 – 2017-02-01 (×4): 600 mg via ORAL
  Filled 2017-01-30 (×4): qty 1

## 2017-01-30 MED ORDER — IPRATROPIUM-ALBUTEROL 0.5-2.5 (3) MG/3ML IN SOLN
3.0000 mL | Freq: Three times a day (TID) | RESPIRATORY_TRACT | Status: DC
  Administered 2017-01-30 – 2017-02-01 (×5): 3 mL via RESPIRATORY_TRACT
  Filled 2017-01-30 (×4): qty 3

## 2017-01-30 NOTE — Progress Notes (Signed)
PROGRESS NOTE  Jorge [name] WUJ:[id number] DOB: Sep 14, 1940 DOA: 01/29/2017 PCP: Blair Heys, MD  HPI/Recap of past 24 hours:  C/o productive Cough, no fever, no edema, not feeling better " I feel terrible"  Assessment/Plan: Principal Problem:   COPD exacerbation (HCC) Active Problems:   OXYGEN-USE OF SUPPLEMENTAL   Depression   Coronary atherosclerosis of native coronary artery   Pure hypercholesterolemia   PVC's (premature ventricular contractions)   COPD (chronic obstructive pulmonary disease) (HCC)   SOB (shortness of breath)   Prolonged QT interval  Copd exacerabtion: -He initially presented to the ED due to progressive sob, not improving with home nebs, he was tachypenic, not able to finish full sentences, has wheezing initial on presentation -cxr no acute findings, does has copd and scaring,  -he does has productive cough,  -will continue iv abx, nebs, steroids, add mucinex,  -will get respiratory viral panel, add urine strep antigen, will also get sputum culture  H/o CAD s/p stent in 2003, last stress test in 12/2015 "No ischemia and no change in mild LV dysfuntion since echo 2015" EKG with st/t flattening/inversion on lateral leads I/avl, possible chronic Mild troponin elevation, flat 0.09-0.06-0.04,  Echo pending,  only on asa and statin at home which is continued  HTN; not on meds at home, stable  BPH; not on meds at home  Code Status: full  Family Communication: patient   Disposition Plan: home once medically stable   Consultants:  none  Procedures:  none  Antibiotics:  doxycycline   Objective: BP (!) 127/57 (BP Location: Left Arm)   Pulse 87   Temp 97.9 F (36.6 C) (Oral)   Resp 18   Ht 5\' 6"  (1.676 m)   Wt 60.3 kg (133 lb) Comment: b scale; rn notified of correct weight  SpO2 98%   BMI 21.47 kg/m   Intake/Output Summary (Last 24 hours) at 01/30/17 1732 Last data filed at 01/30/17 0600  Gross per 24 hour  Intake           1646.25 ml  Output             1025 ml  Net           621.25 ml   Filed Weights   01/29/17 0545 01/29/17 1353 01/30/17 0652  Weight: 60.8 kg (134 lb) 131.6 kg (290 lb 2 oz) 60.3 kg (133 lb)    Exam:   General:  Does not look comfortable, sitting on bed,  Cardiovascular: tachycardia has resolved, now normal sinus rhythm  Respiratory: very diminished overall, previously documented wheezing seems has resolved  Abdomen: Soft/ND/NT, positive BS  Musculoskeletal: No Edema  Neuro: aaox3  Data Reviewed: Basic Metabolic Panel:  Recent Labs Lab 01/29/17 0557 01/29/17 1429 01/30/17 0215  NA 140  --  139  K 3.9  --  4.1  CL 102  --  107  CO2 29  --  25  GLUCOSE 163*  --  157*  BUN 8  --  10  CREATININE 0.95  --  0.89  CALCIUM 8.8*  --  8.8*  MG  --  1.9  --   PHOS  --  2.5  --    Liver Function Tests:  Recent Labs Lab 01/30/17 0215  AST 21  ALT 17  ALKPHOS 83  BILITOT 0.8  PROT 6.3*  ALBUMIN 3.3*   No results for input(s): LIPASE, AMYLASE in the last 168 hours. No results for input(s): AMMONIA in the last 168 hours. CBC:  Recent Labs Lab 01/29/17 0557 01/30/17 0215  WBC 9.3 11.3*  NEUTROABS 5.6  --   HGB 15.0 14.0  HCT 44.4 41.1  MCV 94.9 93.6  PLT 239 246   Cardiac Enzymes:    Recent Labs Lab 01/29/17 1429 01/29/17 1925 01/30/17 0215 01/30/17 0822  TROPONINI 0.09* 0.06* 0.04* 0.04*   BNP (last 3 results)  Recent Labs  01/29/17 0557  BNP 29.0    ProBNP (last 3 results) No results for input(s): PROBNP in the last 8760 hours.  CBG: No results for input(s): GLUCAP in the last 168 hours.  Recent Results (from the past 240 hour(s))  MRSA PCR Screening     Status: Abnormal   Collection Time: 01/29/17  3:52 PM  Result Value Ref Range Status   MRSA by PCR POSITIVE (A) NEGATIVE Final    Comment:        The GeneXpert MRSA Assay (FDA approved for NASAL specimens only), is one component of a comprehensive MRSA colonization surveillance  program. It is not intended to diagnose MRSA infection nor to guide or monitor treatment for MRSA infections. RESULT CALLED TO, READ BACK BY AND VERIFIED WITH: H. Bullins RN 17:40 01/29/17 ([email or web address])      Studies: X-ray Chest Pa And Lateral  Result Date: 01/30/2017 CLINICAL DATA:  COPD. EXAM: CHEST  2 VIEW COMPARISON:  Chest x-ray 01/29/2017, 02/28/2015, 08/23/2013 FINDINGS: Mediastinum and hilar structures normal. Heart size normal. COPD. No focal infiltrate. Right apical and bibasilar pleural-parenchymal thickening again noted consistent with scarring. No pleural effusion or pneumothorax. Degenerative changes thoracic spine with stable mild upper thoracic vertebral body compression. Prior cervicothoracic spine fusion . IMPRESSION: COPD. Right apical and bibasilar pleural-parenchymal scarring. No acute pulmonary disease noted. Electronically Signed   By: Maisie Fus  Register   On: 01/30/2017 07:19    Scheduled Meds: . aspirin  81 mg Oral Daily  . atorvastatin  40 mg Oral q1800  . Chlorhexidine Gluconate Cloth  6 each Topical Q0600  . enoxaparin (LOVENOX) injection  40 mg Subcutaneous Q24H  . methylPREDNISolone (SOLU-MEDROL) injection  60 mg Intravenous Q6H  . mupirocin ointment  1 application Nasal BID    Continuous Infusions: . sodium chloride 75 mL/hr at 01/29/17 1606  . doxycycline (VIBRAMYCIN) IV Stopped (01/30/17 1215)     Time spent:  Albie Arizpe MD, PhD  [facility] Pager [phone]. If 7PM-7AM, please contact night-coverage at [email or web address], password [facility] 01/30/2017, 5:32 PM  LOS: 0 days

## 2017-01-30 NOTE — Progress Notes (Signed)
Refused bed alarm. Will continue to monitor patient. 

## 2017-01-30 NOTE — Progress Notes (Signed)
Pt slept well during the night, Vitals stable, no any sign of SOB and distress noted, no any complain of pain, CHG bath provided, Morning EKG done, will continue to monitor the patient.

## 2017-01-30 NOTE — Progress Notes (Signed)
  Echocardiogram 2D Echocardiogram has been performed.  Jorge [name] 01/30/2017, 3:02 PM

## 2017-01-31 LAB — COMPREHENSIVE METABOLIC PANEL
ALT: 17 U/L (ref 17–63)
AST: 25 U/L (ref 15–41)
Albumin: 3.1 g/dL — ABNORMAL LOW (ref 3.5–5.0)
Alkaline Phosphatase: 80 U/L (ref 38–126)
Anion gap: 6 (ref 5–15)
BUN: 15 mg/dL (ref 6–20)
CHLORIDE: 105 mmol/L (ref 101–111)
CO2: 30 mmol/L (ref 22–32)
Calcium: 8.5 mg/dL — ABNORMAL LOW (ref 8.9–10.3)
Creatinine, Ser: 0.83 mg/dL (ref 0.61–1.24)
Glucose, Bld: 153 mg/dL — ABNORMAL HIGH (ref 65–99)
POTASSIUM: 3.9 mmol/L (ref 3.5–5.1)
SODIUM: 141 mmol/L (ref 135–145)
Total Bilirubin: 0.8 mg/dL (ref 0.3–1.2)
Total Protein: 5.9 g/dL — ABNORMAL LOW (ref 6.5–8.1)

## 2017-01-31 LAB — RESPIRATORY PANEL BY PCR
Adenovirus: NOT DETECTED
BORDETELLA PERTUSSIS-RVPCR: NOT DETECTED
Chlamydophila pneumoniae: NOT DETECTED
Coronavirus 229E: NOT DETECTED
Coronavirus HKU1: NOT DETECTED
Coronavirus NL63: NOT DETECTED
Coronavirus OC43: NOT DETECTED
INFLUENZA B-RVPPCR: NOT DETECTED
Influenza A: NOT DETECTED
METAPNEUMOVIRUS-RVPPCR: NOT DETECTED
Mycoplasma pneumoniae: NOT DETECTED
PARAINFLUENZA VIRUS 1-RVPPCR: NOT DETECTED
PARAINFLUENZA VIRUS 2-RVPPCR: NOT DETECTED
PARAINFLUENZA VIRUS 3-RVPPCR: NOT DETECTED
Parainfluenza Virus 4: NOT DETECTED
RESPIRATORY SYNCYTIAL VIRUS-RVPPCR: NOT DETECTED
RHINOVIRUS / ENTEROVIRUS - RVPPCR: NOT DETECTED

## 2017-01-31 LAB — CBC WITH DIFFERENTIAL/PLATELET
BASOS ABS: 0 10*3/uL (ref 0.0–0.1)
Basophils Relative: 0 %
EOS ABS: 0 10*3/uL (ref 0.0–0.7)
EOS PCT: 0 %
HCT: 39.6 % (ref 39.0–52.0)
Hemoglobin: 13.1 g/dL (ref 13.0–17.0)
LYMPHS ABS: 0.8 10*3/uL (ref 0.7–4.0)
LYMPHS PCT: 5 %
MCH: 31.3 pg (ref 26.0–34.0)
MCHC: 33.1 g/dL (ref 30.0–36.0)
MCV: 94.7 fL (ref 78.0–100.0)
MONO ABS: 0.4 10*3/uL (ref 0.1–1.0)
Monocytes Relative: 2 %
Neutro Abs: 16.1 10*3/uL — ABNORMAL HIGH (ref 1.7–7.7)
Neutrophils Relative %: 93 %
PLATELETS: 277 10*3/uL (ref 150–400)
RBC: 4.18 MIL/uL — AB (ref 4.22–5.81)
RDW: 13.1 % (ref 11.5–15.5)
WBC: 17.3 10*3/uL — AB (ref 4.0–10.5)

## 2017-01-31 LAB — STREP PNEUMONIAE URINARY ANTIGEN: STREP PNEUMO URINARY ANTIGEN: NEGATIVE

## 2017-01-31 LAB — LIPID PANEL
CHOL/HDL RATIO: 3.1 ratio
CHOLESTEROL: 116 mg/dL (ref 0–200)
HDL: 38 mg/dL — ABNORMAL LOW (ref >40)
LDL Cholesterol: 61 mg/dL (ref 0–99)
Triglycerides: 86 mg/dL (ref <150)
VLDL: 17 mg/dL (ref 0–40)

## 2017-01-31 LAB — ECHOCARDIOGRAM COMPLETE
HEIGHTINCHES: 66 in
Weight: 2128 oz

## 2017-01-31 LAB — EXPECTORATED SPUTUM ASSESSMENT W GRAM STAIN, RFLX TO RESP C

## 2017-01-31 MED ORDER — METHYLPREDNISOLONE SODIUM SUCC 125 MG IJ SOLR
60.0000 mg | Freq: Two times a day (BID) | INTRAMUSCULAR | Status: DC
  Administered 2017-02-01: 60 mg via INTRAVENOUS
  Filled 2017-01-31: qty 2

## 2017-01-31 NOTE — Progress Notes (Signed)
PROGRESS NOTE  Jorge [name] SMO:[id number] DOB: [birth year] DOA: 01/29/2017 PCP: Blair Heys, MD  HPI/Recap of past 24 hours:  Feeling better, report chest continue to feel tight, not able to cough up  no fever, no edema,   Assessment/Plan: Principal Problem:   COPD exacerbation (HCC) Active Problems:   OXYGEN-USE OF SUPPLEMENTAL   Depression   Coronary atherosclerosis of native coronary artery   Pure hypercholesterolemia   PVC's (premature ventricular contractions)   COPD (chronic obstructive pulmonary disease) (HCC)   SOB (shortness of breath)   Prolonged QT interval  Copd exacerabtion: -he report has been having problems for the last 5-6 weeks ,he was seen by his pulmonologist and finished prednisone and doxycycline, however, symptom returned after stopping the medicine   -He initially presented to the ED due to progressive sob, not improving with home nebs, he was tachypenic, not able to finish full sentences, has wheezing initial on presentation -cxr no acute findings, does has copd and scaring,  -he is started on iv abx, nebs, iv steroids, add mucinex,  -negative respiratory viral panel,  urine strep antigen negative, ,  sputum culture pending recollection -seems improving, wheezing and cough has improved, but lung still very tight on exam, will taper iv steroids from q6hrs to q12 hrs, if he continues to improve, consider discharge home with slow steroid taper and close follow up with pulmonology  H/o CAD s/p stent in 2003, last stress test in 12/2015 "No ischemia and no change in mild LV dysfuntion since echo 2015" EKG with st/t flattening/inversion on lateral leads I/avl, possible chronic Mild troponin elevation, flat 0.09-0.06-0.04,  Echo unremarkable,  only on asa and statin at home which is continued  H/o HTN; not on meds at home, stable  H/o BPH; not on meds at home  MRSA colonization: on decooinization protocol, contact precaution.   Code Status:  full  Family Communication: patient   Disposition Plan: home once medically stable   Consultants:  none  Procedures:  none  Antibiotics:  doxycycline   Objective: BP 135/72 (BP Location: Left Arm)   Pulse 89   Temp 97.8 F (36.6 C) (Oral)   Resp 17   Ht 5\' 6"  (1.676 m)   Wt 60.3 kg (133 lb) Comment: b scale; rn notified of correct weight  SpO2 97%   BMI 21.47 kg/m   Intake/Output Summary (Last 24 hours) at 01/31/17 1452 Last data filed at 01/31/17 1337  Gross per 24 hour  Intake           3847.5 ml  Output             2545 ml  Net           1302.5 ml   Filed Weights   01/29/17 0545 01/29/17 1353 01/30/17 0652  Weight: 60.8 kg (134 lb) 131.6 kg (290 lb 2 oz) 60.3 kg (133 lb)    Exam:   General:  Seems better, now able to talk more, on room air, still has intermittent cough,  sitting on bed,  Cardiovascular: tachycardia has resolved, now normal sinus rhythm  Respiratory: very diminished overall, previously documented wheezing seems has resolved  Abdomen: Soft/ND/NT, positive BS  Musculoskeletal: No Edema  Neuro: aaox3  Data Reviewed: Basic Metabolic Panel:  Recent Labs Lab 01/29/17 0557 01/29/17 1429 01/30/17 0215 01/31/17 0506  NA 140  --  139 141  K 3.9  --  4.1 3.9  CL 102  --  107 105  CO2 29  --  25 30  GLUCOSE 163*  --  157* 153*  BUN 8  --  10 15  CREATININE 0.95  --  0.89 0.83  CALCIUM 8.8*  --  8.8* 8.5*  MG  --  1.9  --   --   PHOS  --  2.5  --   --    Liver Function Tests:  Recent Labs Lab 01/30/17 0215 01/31/17 0506  AST 21 25  ALT 17 17  ALKPHOS 83 80  BILITOT 0.8 0.8  PROT 6.3* 5.9*  ALBUMIN 3.3* 3.1*   No results for input(s): LIPASE, AMYLASE in the last 168 hours. No results for input(s): AMMONIA in the last 168 hours. CBC:  Recent Labs Lab 01/29/17 0557 01/30/17 0215 01/31/17 0506  WBC 9.3 11.3* 17.3*  NEUTROABS 5.6  --  16.1*  HGB 15.0 14.0 13.1  HCT 44.4 41.1 39.6  MCV 94.9 93.6 94.7  PLT 239  246 277   Cardiac Enzymes:    Recent Labs Lab 01/29/17 1429 01/29/17 1925 01/30/17 0215 01/30/17 0822  TROPONINI 0.09* 0.06* 0.04* 0.04*   BNP (last 3 results)  Recent Labs  01/29/17 0557  BNP 29.0    ProBNP (last 3 results) No results for input(s): PROBNP in the last 8760 hours.  CBG: No results for input(s): GLUCAP in the last 168 hours.  Recent Results (from the past 240 hour(s))  MRSA PCR Screening     Status: Abnormal   Collection Time: 01/29/17  3:52 PM  Result Value Ref Range Status   MRSA by PCR POSITIVE (A) NEGATIVE Final    Comment:        The GeneXpert MRSA Assay (FDA approved for NASAL specimens only), is one component of a comprehensive MRSA colonization surveillance program. It is not intended to diagnose MRSA infection nor to guide or monitor treatment for MRSA infections. RESULT CALLED TO, READ BACK BY AND VERIFIED WITH: H. Bullins RN 17:40 01/29/17 ([email or web address])   Culture, expectorated sputum-assessment     Status: None   Collection Time: 01/30/17  8:01 PM  Result Value Ref Range Status   Specimen Description EXPECTORATED SPUTUM  Final   Special Requests NONE  Final   Sputum evaluation   Final    Sputum specimen not acceptable for testing.  Please recollect.   Results Called to: Abigail Butts, RN AT [phone] ON 01/31/17 BY C. JESSUP, MLT.    Report Status 01/31/2017 FINAL  Final  Respiratory Panel by PCR     Status: None   Collection Time: 01/30/17  9:45 PM  Result Value Ref Range Status   Adenovirus NOT DETECTED NOT DETECTED Final   Coronavirus 229E NOT DETECTED NOT DETECTED Final   Coronavirus HKU1 NOT DETECTED NOT DETECTED Final   Coronavirus NL63 NOT DETECTED NOT DETECTED Final   Coronavirus OC43 NOT DETECTED NOT DETECTED Final   Metapneumovirus NOT DETECTED NOT DETECTED Final   Rhinovirus / Enterovirus NOT DETECTED NOT DETECTED Final   Influenza A NOT DETECTED NOT DETECTED Final   Influenza B NOT DETECTED NOT DETECTED Final   Parainfluenza  Virus 1 NOT DETECTED NOT DETECTED Final   Parainfluenza Virus 2 NOT DETECTED NOT DETECTED Final   Parainfluenza Virus 3 NOT DETECTED NOT DETECTED Final   Parainfluenza Virus 4 NOT DETECTED NOT DETECTED Final   Respiratory Syncytial Virus NOT DETECTED NOT DETECTED Final   Bordetella pertussis NOT DETECTED NOT DETECTED Final   Chlamydophila pneumoniae NOT DETECTED NOT DETECTED Final   Mycoplasma pneumoniae NOT DETECTED NOT DETECTED  Final     Studies: No results found.  Scheduled Meds: . aspirin  81 mg Oral Daily  . atorvastatin  40 mg Oral q1800  . Chlorhexidine Gluconate Cloth  6 each Topical Q0600  . enoxaparin (LOVENOX) injection  40 mg Subcutaneous Q24H  . guaiFENesin  600 mg Oral BID  . ipratropium-albuterol  3 mL Nebulization TID  . methylPREDNISolone (SOLU-MEDROL) injection  60 mg Intravenous Q6H  . mupirocin ointment  1 application Nasal BID    Continuous Infusions: . sodium chloride 75 mL/hr at 01/31/17 0030  . doxycycline (VIBRAMYCIN) IV Stopped (01/31/17 1028)     Time spent:  Vinson Tietze MD, PhD  [facility] Pager [phone]. If 7PM-7AM, please contact night-coverage at [email or web address], password [facility] 01/31/2017, 2:52 PM  LOS: 1 day

## 2017-01-31 NOTE — Progress Notes (Signed)
Patient refused bed alarm. Will continue to monitor patient. 

## 2017-01-31 NOTE — Plan of Care (Signed)
Problem: Education: Goal: Knowledge of the prescribed therapeutic regimen will improve Outcome: Progressing Patient verbalizes understanding that his COPD is being treated with IV steroids and antibiotics   Problem: Respiratory: Goal: Levels of oxygenation will improve Outcome: Progressing Patient maintains oxygen level 95-100% on room air

## 2017-01-31 NOTE — Progress Notes (Signed)
RN noted respiratory panel negative, droplet precautions discontinued.  Patient notified of results.

## 2017-01-31 NOTE — Progress Notes (Signed)
Patient stable during 7 a to 7 p shift, remains 95-100% on room air, some mild wheezing heard.  Patient continues to be short of breath with exertion but states this is improved.  Patient hopeful to be discharged tomorrow.  Son at bedtime for part of shift.

## 2017-02-01 DIAGNOSIS — I2510 Atherosclerotic heart disease of native coronary artery without angina pectoris: Secondary | ICD-10-CM

## 2017-02-01 DIAGNOSIS — J441 Chronic obstructive pulmonary disease with (acute) exacerbation: Secondary | ICD-10-CM

## 2017-02-01 DIAGNOSIS — J431 Panlobular emphysema: Secondary | ICD-10-CM

## 2017-02-01 DIAGNOSIS — N400 Enlarged prostate without lower urinary tract symptoms: Secondary | ICD-10-CM

## 2017-02-01 DIAGNOSIS — I10 Essential (primary) hypertension: Secondary | ICD-10-CM

## 2017-02-01 LAB — BASIC METABOLIC PANEL
ANION GAP: 5 (ref 5–15)
BUN: 16 mg/dL (ref 6–20)
CALCIUM: 8.6 mg/dL — AB (ref 8.9–10.3)
CO2: 33 mmol/L — ABNORMAL HIGH (ref 22–32)
Chloride: 104 mmol/L (ref 101–111)
Creatinine, Ser: 0.88 mg/dL (ref 0.61–1.24)
GFR calc Af Amer: >60 mL/min (ref >60)
GLUCOSE: 174 mg/dL — AB (ref 65–99)
Potassium: 3.5 mmol/L (ref 3.5–5.1)
Sodium: 142 mmol/L (ref 135–145)

## 2017-02-01 LAB — CBC WITH DIFFERENTIAL/PLATELET
BASOS ABS: 0 10*3/uL (ref 0.0–0.1)
Basophils Relative: 0 %
EOS PCT: 0 %
Eosinophils Absolute: 0 10*3/uL (ref 0.0–0.7)
HCT: 41.1 % (ref 39.0–52.0)
Hemoglobin: 13.7 g/dL (ref 13.0–17.0)
LYMPHS PCT: 6 %
Lymphs Abs: 1 10*3/uL (ref 0.7–4.0)
MCH: 31.6 pg (ref 26.0–34.0)
MCHC: 33.3 g/dL (ref 30.0–36.0)
MCV: 94.7 fL (ref 78.0–100.0)
MONO ABS: 1.1 10*3/uL — AB (ref 0.1–1.0)
Monocytes Relative: 7 %
Neutro Abs: 13.5 10*3/uL — ABNORMAL HIGH (ref 1.7–7.7)
Neutrophils Relative %: 87 %
PLATELETS: 263 10*3/uL (ref 150–400)
RBC: 4.34 MIL/uL (ref 4.22–5.81)
RDW: 12.9 % (ref 11.5–15.5)
WBC: 15.5 10*3/uL — ABNORMAL HIGH (ref 4.0–10.5)

## 2017-02-01 LAB — MAGNESIUM: Magnesium: 2.2 mg/dL (ref 1.7–2.4)

## 2017-02-01 MED ORDER — PREDNISONE 20 MG PO TABS: 80.0000 mg | ORAL_TABLET | Freq: Every day | ORAL | Status: DC

## 2017-02-01 MED ORDER — PREDNISONE 10 MG PO TABS: 10.0000 mg | ORAL_TABLET | Freq: Every day | ORAL | 0 refills | Status: AC

## 2017-02-01 MED ORDER — DOXYCYCLINE HYCLATE 100 MG PO TABS: 100.0000 mg | ORAL_TABLET | Freq: Two times a day (BID) | ORAL | 0 refills | Status: DC

## 2017-02-01 MED ORDER — PREDNISONE 20 MG PO TABS: 40.0000 mg | ORAL_TABLET | Freq: Every day | ORAL | 0 refills | Status: DC

## 2017-02-01 MED ORDER — PREDNISONE 20 MG PO TABS: 80.0000 mg | ORAL_TABLET | Freq: Every day | ORAL | 0 refills | Status: AC

## 2017-02-01 MED ORDER — PREDNISONE 20 MG PO TABS: 20.0000 mg | ORAL_TABLET | Freq: Every day | ORAL | 0 refills | Status: DC

## 2017-02-01 MED ORDER — PREDNISONE 5 MG/5ML PO SOLN: 80.0000 mg | Freq: Every day | ORAL | Status: DC

## 2017-02-01 MED ORDER — PREDNISONE 1 MG PO TABS: 5.0000 mg | ORAL_TABLET | Freq: Every day | ORAL | 0 refills | Status: DC

## 2017-02-01 MED ORDER — PREDNISONE 20 MG PO TABS: 60.0000 mg | ORAL_TABLET | Freq: Every day | ORAL | 0 refills | Status: DC

## 2017-02-01 MED ORDER — DOXYCYCLINE HYCLATE 100 MG PO TABS
100.0000 mg | ORAL_TABLET | Freq: Two times a day (BID) | ORAL | Status: DC
  Administered 2017-02-01: 100 mg via ORAL
  Filled 2017-02-01: qty 1

## 2017-02-01 MED ORDER — GUAIFENESIN ER 600 MG PO TB12: 600.0000 mg | ORAL_TABLET | Freq: Two times a day (BID) | ORAL | 0 refills | Status: AC

## 2017-02-01 MED ORDER — ATORVASTATIN CALCIUM 40 MG PO TABS: 40.0000 mg | ORAL_TABLET | Freq: Every day | ORAL | 0 refills | Status: DC

## 2017-02-01 NOTE — Discharge Summary (Signed)
Physician Discharge Summary  Jorge [name] ERX:[id number] DOB: [birth year] DOA: 01/29/2017  PCP: Blair Heys, MD  Admit date: 01/29/2017 Discharge date: 02/01/2017  Time spent: 35 minutes  Recommendations for Outpatient Follow-up:  Copd exacerabtion: -he report has been having problems for the last 5-6 weeks ,he was seen by his pulmonologist and finished prednisone and doxycycline, however, symptom returned after stopping the medicine  -CXR no acute findings, does have COPD w/ scaring,  -Complete 7 days ABX -Place on 21 day steroid taper. Will discharge on 80 mg daily -Schedule follow-up with NP Tammy Parrett [facility] in 2 weeks, COPD exacerbation of steroid taper  H/o CAD s/p stent in 2003,  -last stress test in 12/2015 "No ischemia and no change in mild LV dysfuntion since echo 2015" -EKG with st/t flattening/inversion on lateral leads I/avl, possible chronic Mild troponin elevation, flat 0.09-0.06-0.04,  -Echo unremarkable, -Schedule Follow with Dr. Blair Heys in one week for HTN, CAD, COPD.  Essential HTN -not on meds at home, stable  BPH -not on meds at home  MRSA colonization          Discharge Diagnoses:  Principal Problem:   COPD exacerbation (HCC) Active Problems:   OXYGEN-USE OF SUPPLEMENTAL   Depression   Coronary atherosclerosis of native coronary artery   Pure hypercholesterolemia   PVC's (premature ventricular contractions)   COPD (chronic obstructive pulmonary disease) (HCC)   SOB (shortness of breath)   Prolonged QT interval   Discharge Condition: Stable  Diet recommendation: Heart healthy  Filed Weights   01/29/17 1353 01/30/17 0652 02/01/17 0605  Weight: 290 lb 2 oz (131.6 kg) 133 lb (60.3 kg) 134 lb (60.8 kg)    History of present illness:  76 y.o.WM PMHx CAD status post stent placement, COPD - emphysema, HTN, HLD, followed as outpatient by pulmonary,   Presenting to the ED with 2 day history of worsening shortness of  breath. The patient has been coughingwhite and gray sputum, but denies any fever or chills. He denies any chest pain or palpitations or pleuritic chest pain. The patient has chronic chest discomfort however, which is unchanged. Of note, he reports sick contact on his brother, who was recently diagnosed with pneumonia.Deneis recent long distance trips. .Denies tobacco No lower extremity swelling or pain. The patient tried nebulizer treatments at home without relief. Denies rhinorrhea or hemoptysis. Denies any abdominal pain. Has decreased appetite due to current symptoms. No confusion was reported. Denies any vision changes, double vision or headaches.  During his hospitalization patient was treated for COPD exacerbation. Will have patient complete course of antibiotics and placed on a slow steroid taper. Follow-up with pulmonology Dr. Vassie Loll.   Procedures: 6/22 Echocardiogram:Left ventricle: Abnormal septal motion. -LVEF =55% to 60%.    Consultations: None  Culture 6/21 MRSA by PCR positive 6/22 sputum negative 6/22 respiratory virus panel negative   Antibiotics Anti-infectives    Start     Stop   02/01/17 1000  doxycycline (VIBRA-TABS) tablet 100 mg         01/29/17 0900  doxycycline (VIBRAMYCIN) 100 mg in dextrose 5 % 250 mL IVPB  Status:  Discontinued     02/01/17 0911       Discharge Exam: Vitals:   01/31/17 2014 01/31/17 2023 02/01/17 0605 02/01/17 0949  BP: (!) 145/67  (!) 142/69   Pulse: 93  79   Resp: 20  20   Temp: 98.2 F (36.8 C)  98.3 F (36.8 C)   TempSrc: Oral  Oral  SpO2: 96% 96% 97% 96%  Weight:   134 lb (60.8 kg)   Height:        General: A/O 4, positive chronic respiratory failure Cardiovascular: Regular in rate, negative murmurs rubs gallops, normal S1/S2 Respiratory: Diffuse poor air movement, mild expiratory wheezing, negative crackles    Discharge Instructions   Allergies as of 02/01/2017      Reactions   Adenosine Other (See Comments)    Bronchospasms   Codeine Other (See Comments)   nervousness, ornery   Erythromycin Nausea Only   Upset stomach   Sudafed [pseudoephedrine] Other (See Comments)   Urinary Sx   Sulfa Antibiotics Rash      Medication List    STOP taking these medications   simvastatin 80 MG tablet Commonly known as:  ZOCOR Replaced by:  atorvastatin 40 MG tablet     TAKE these medications   albuterol 108 (90 Base) MCG/ACT inhaler Commonly known as:  PROVENTIL HFA;VENTOLIN HFA Inhale 2 puffs into the lungs every 6 (six) hours as needed for wheezing or shortness of breath.   albuterol (2.5 MG/3ML) 0.083% nebulizer solution Commonly known as:  PROVENTIL Take 3 mLs (2.5 mg total) by nebulization every 6 (six) hours as needed for wheezing or shortness of breath. Reported on 10/02/2015   aspirin 81 MG tablet Take 81 mg by mouth daily.   atorvastatin 40 MG tablet Commonly known as:  LIPITOR Take 1 tablet (40 mg total) by mouth daily at 6 PM. Replaces:  simvastatin 80 MG tablet   doxycycline 100 MG tablet Commonly known as:  VIBRA-TABS Take 1 tablet (100 mg total) by mouth every 12 (twelve) hours.   guaiFENesin 600 MG 12 hr tablet Commonly known as:  MUCINEX Take 1 tablet (600 mg total) by mouth 2 (two) times daily.   predniSONE 20 MG tablet Commonly known as:  DELTASONE Take 4 tablets (80 mg total) by mouth daily with breakfast. Start taking on:  02/02/2017   predniSONE 20 MG tablet Commonly known as:  DELTASONE Take 3 tablets (60 mg total) by mouth daily with breakfast. Start taking on:  02/06/2017   predniSONE 20 MG tablet Commonly known as:  DELTASONE Take 2 tablets (40 mg total) by mouth daily with breakfast. Start taking on:  02/10/2017   predniSONE 10 MG tablet Commonly known as:  DELTASONE Take 1 tablet (10 mg total) by mouth daily with breakfast. Start taking on:  02/18/2017   predniSONE 1 MG tablet Commonly known as:  DELTASONE Take 5 tablets (5 mg total) by mouth daily with  breakfast. Start taking on:  02/22/2017   predniSONE 20 MG tablet Commonly known as:  DELTASONE Take 1 tablet (20 mg total) by mouth daily with breakfast. Start taking on:  02/14/2018   Tiotropium Bromide-Olodaterol 2.5-2.5 MCG/ACT Aers Commonly known as:  STIOLTO RESPIMAT Inhale 2 puffs into the lungs daily.      Allergies  Allergen Reactions  . Adenosine Other (See Comments)    Bronchospasms   . Codeine Other (See Comments)    nervousness, ornery  . Erythromycin Nausea Only    Upset stomach  . Sudafed [Pseudoephedrine] Other (See Comments)    Urinary Sx  . Sulfa Antibiotics Rash   Follow-up Information    Blair Heys, MD. Schedule an appointment as soon as possible for a visit in 1 week(s).   Specialty:  Family Medicine Why:  hospital discharge follow up Contact information: [address] [place] [address] [phone]  Parrett, Tammy S, NP Follow up in 2 week(s).   Specialty:  Pulmonary Disease Contact information: [address] [place] [address] [phone]            The results of significant diagnostics from this hospitalization (including imaging, microbiology, ancillary and laboratory) are listed below for reference.    Significant Diagnostic Studies: X-ray Chest Pa And Lateral  Result Date: 01/30/2017 CLINICAL DATA:  COPD. EXAM: CHEST  2 VIEW COMPARISON:  Chest x-ray 01/29/2017, 02/28/2015, 08/23/2013 FINDINGS: Mediastinum and hilar structures normal. Heart size normal. COPD. No focal infiltrate. Right apical and bibasilar pleural-parenchymal thickening again noted consistent with scarring. No pleural effusion or pneumothorax. Degenerative changes thoracic spine with stable mild upper thoracic vertebral body compression. Prior cervicothoracic spine fusion . IMPRESSION: COPD. Right apical and bibasilar pleural-parenchymal scarring. No acute pulmonary disease noted. Electronically Signed   By: Maisie Fus  Register   On:  01/30/2017 07:19   Dg Chest 2 View  Result Date: 01/08/2017 CLINICAL DATA:  COPD.  Increased cough EXAM: CHEST  2 VIEW COMPARISON:  02/28/2015 FINDINGS: Hyperinflation and emphysematous markings. Chronic asymmetric right apical pleural thickening and streaky parenchymal opacity without detected change since 2016. Normal heart size and mediastinal contours. Coronary stenting. No acute osseous finding IMPRESSION: 1. No acute finding. 2. COPD and chronic right apical scar-like opacity. Electronically Signed   By: Marnee Spring M.D.   On: 01/08/2017 12:28   Dg Chest Portable 1 View  Result Date: 01/29/2017 CLINICAL DATA:  Shortness of breath. EXAM: PORTABLE CHEST 1 VIEW COMPARISON:  01/08/2017.  08/23/2013. FINDINGS: Mediastinum and hilar structures are normal. Coronary stents noted. Heart size normal. COPD. Right apical and bibasal pleuroparenchymal thickening consistent with scarring again noted. No acute bony abnormality. Prior cervicothoracic spine fusion. Degenerative changes thoracic spine. Surgical clips right upper quadrant. IMPRESSION: 1. Right apical lung bibasal pleuroparenchymal thickening consistent with scarring. 2. COPD.  No acute abnormality. Electronically Signed   By: Maisie Fus  Register   On: 01/29/2017 06:13    Microbiology: Recent Results (from the past 240 hour(s))  MRSA PCR Screening     Status: Abnormal   Collection Time: 01/29/17  3:52 PM  Result Value Ref Range Status   MRSA by PCR POSITIVE (A) NEGATIVE Final    Comment:        The GeneXpert MRSA Assay (FDA approved for NASAL specimens only), is one component of a comprehensive MRSA colonization surveillance program. It is not intended to diagnose MRSA infection nor to guide or monitor treatment for MRSA infections. RESULT CALLED TO, READ BACK BY AND VERIFIED WITH: H. Bullins RN 17:40 01/29/17 ([email or web address])   Culture, expectorated sputum-assessment     Status: None   Collection Time: 01/30/17  8:01 PM  Result Value Ref  Range Status   Specimen Description EXPECTORATED SPUTUM  Final   Special Requests NONE  Final   Sputum evaluation   Final    Sputum specimen not acceptable for testing.  Please recollect.   Results Called to: Abigail Butts, RN AT [phone] ON 01/31/17 BY C. JESSUP, MLT.    Report Status 01/31/2017 FINAL  Final  Respiratory Panel by PCR     Status: None   Collection Time: 01/30/17  9:45 PM  Result Value Ref Range Status   Adenovirus NOT DETECTED NOT DETECTED Final   Coronavirus 229E NOT DETECTED NOT DETECTED Final   Coronavirus HKU1 NOT DETECTED NOT DETECTED Final   Coronavirus NL63 NOT DETECTED NOT DETECTED Final   Coronavirus OC43 NOT DETECTED  NOT DETECTED Final   Metapneumovirus NOT DETECTED NOT DETECTED Final   Rhinovirus / Enterovirus NOT DETECTED NOT DETECTED Final   Influenza A NOT DETECTED NOT DETECTED Final   Influenza B NOT DETECTED NOT DETECTED Final   Parainfluenza Virus 1 NOT DETECTED NOT DETECTED Final   Parainfluenza Virus 2 NOT DETECTED NOT DETECTED Final   Parainfluenza Virus 3 NOT DETECTED NOT DETECTED Final   Parainfluenza Virus 4 NOT DETECTED NOT DETECTED Final   Respiratory Syncytial Virus NOT DETECTED NOT DETECTED Final   Bordetella pertussis NOT DETECTED NOT DETECTED Final   Chlamydophila pneumoniae NOT DETECTED NOT DETECTED Final   Mycoplasma pneumoniae NOT DETECTED NOT DETECTED Final     Labs: Basic Metabolic Panel:  Recent Labs Lab 01/29/17 0557 01/29/17 1429 01/30/17 0215 01/31/17 0506 02/01/17 0328  NA 140  --  139 141 142  K 3.9  --  4.1 3.9 3.5  CL 102  --  107 105 104  CO2 29  --  25 30 33*  GLUCOSE 163*  --  157* 153* 174*  BUN 8  --  10 15 16   CREATININE 0.95  --  0.89 0.83 0.88  CALCIUM 8.8*  --  8.8* 8.5* 8.6*  MG  --  1.9  --   --  2.2  PHOS  --  2.5  --   --   --    Liver Function Tests:  Recent Labs Lab 01/30/17 0215 01/31/17 0506  AST 21 25  ALT 17 17  ALKPHOS 83 80  BILITOT 0.8 0.8  PROT 6.3* 5.9*  ALBUMIN 3.3* 3.1*   No  results for input(s): LIPASE, AMYLASE in the last 168 hours. No results for input(s): AMMONIA in the last 168 hours. CBC:  Recent Labs Lab 01/29/17 0557 01/30/17 0215 01/31/17 0506 02/01/17 0328  WBC 9.3 11.3* 17.3* 15.5*  NEUTROABS 5.6  --  16.1* 13.5*  HGB 15.0 14.0 13.1 13.7  HCT 44.4 41.1 39.6 41.1  MCV 94.9 93.6 94.7 94.7  PLT 239 246 277 263   Cardiac Enzymes:  Recent Labs Lab 01/29/17 1429 01/29/17 1925 01/30/17 0215 01/30/17 0822  TROPONINI 0.09* 0.06* 0.04* 0.04*   BNP: BNP (last 3 results)  Recent Labs  01/29/17 0557  BNP 29.0    ProBNP (last 3 results) No results for input(s): PROBNP in the last 8760 hours.  CBG: No results for input(s): GLUCAP in the last 168 hours.     Signed:  Carolyne Littles, MD [facility] [phone] pager

## 2017-02-01 NOTE — Progress Notes (Signed)
SATURATION QUALIFICATIONS: (This note is used to comply with regulatory documentation for home oxygen)  Patient Saturations on Room Air at Rest = 97%  Patient Saturations on Room Air while Ambulating = 93-97%   Patient Saturations on  Liters of oxygen while Ambulating = n/a  Please briefly explain why patient needs home oxygen: patient does not qualify for home o2

## 2017-02-01 NOTE — Progress Notes (Signed)
Discharge instructions reviewed with patient and daughter, questions answered, verbalized understanding.  Prescriptions given to patient.  Patient transported to main entrance of the hospital to be taken home by daughter.

## 2017-02-04 ENCOUNTER — Telehealth: Payer: Self-pay | Admitting: Pulmonary Disease

## 2017-02-04 NOTE — Telephone Encounter (Signed)
Ok for4 weeks

## 2017-02-04 NOTE — Telephone Encounter (Signed)
Pt advised of 4 week HFU appt.  Scheduled with TP on 03/05/17 at 2:30 Nothing further needed.

## 2017-02-04 NOTE — Telephone Encounter (Signed)
RA  Please Advise-  Pt was discharged from the Hospital and was told to have a follow up in 1-2 weeks. We do not have any open appts. Our first available with an NP is 4 weeks out. Offered for pt to go see TP in [place] but pt stated he does not wish to go to [place] at all. Please advise if you feel like pt needs to be seen sooner or our first available. Pt states his feels like his breathing is doing ok

## 2017-02-10 DIAGNOSIS — R7301 Impaired fasting glucose: Secondary | ICD-10-CM | POA: Diagnosis not present

## 2017-03-05 ENCOUNTER — Ambulatory Visit (INDEPENDENT_AMBULATORY_CARE_PROVIDER_SITE_OTHER): Payer: PPO | Admitting: Adult Health

## 2017-03-05 ENCOUNTER — Encounter: Payer: Self-pay | Admitting: Adult Health

## 2017-03-05 VITALS — BP 108/58 | HR 107 | Ht 66.0 in | Wt 130.4 lb

## 2017-03-05 DIAGNOSIS — J449 Chronic obstructive pulmonary disease, unspecified: Secondary | ICD-10-CM | POA: Diagnosis not present

## 2017-03-05 MED ORDER — PREDNISONE 10 MG PO TABS: ORAL_TABLET | ORAL | 1 refills | Status: DC

## 2017-03-05 NOTE — Addendum Note (Signed)
Addended by: Boone Master E on: 03/05/2017 05:38 PM   Modules accepted: Orders

## 2017-03-05 NOTE — Assessment & Plan Note (Addendum)
GOLD IV COPD  Slowly resolving flare  Will try low dose steroids for a while to see if this helps.  Steroid pt education given to pt /daughter.   Plan  Patient Instructions  Continue on Stiolto 2 puffs daily . Rinse after use .  Restart Prednisone 20mg  daily for 1 week and 10mg  daily , hold  Follow up Dr. Vassie Loll  In 6 -8 weeks  and As needed   Please contact office for sooner follow up if symptoms do not improve or worsen or seek emergency care

## 2017-03-05 NOTE — Progress Notes (Signed)
@Patient  ID: Jorge [name], male    DOB: 23-May-1941, 76 y.o.   MRN: [id number]  Chief Complaint  Patient presents with  . Follow-up    COPD     Referring provider: Blair Heys, MD  HPI: 76 year old male former smoker followed for COPD Hospital admit 2011 with a right upper lobe MRSA pneumonia compensated by right pneumothorax  TEST  CT chest 01/19/14 - stable Chronic post infectious changes throughout the lungs bilaterally, LUL ground glass opacity, stable compared to 08/2013 CT chest 05/2015 stable COPD changes and nodules.  12/2015 cardiac stress test -w/ EF at 40%. No evidence of ischemia .   03/05/2017 Follow up : COPD  Patient presents for a follow-up. Patient was recently hospitalized last month for COPD exacerbation. He was treated with antibiotics and a steroid taper. He says he got feeling better but when he stopped prednisone he feels breathing started to get worse . Felt breathing was better on steroids . He denies increased cough or wheezing .  No fever or discolored mucus .  Remains on Stiolto .  CXR showed chronic COPD changes  Hosptial records reviewed.  Spirometry today shows an FEV1 32% at, ratio 29, FVC 79 Declines pulmonary rehab referral    Allergies  Allergen Reactions  . Adenosine Other (See Comments)    Bronchospasms   . Codeine Other (See Comments)    nervousness, ornery  . Erythromycin Nausea Only    Upset stomach  . Sudafed [Pseudoephedrine] Other (See Comments)    Urinary Sx  . Sulfa Antibiotics Rash    Immunization History  Administered Date(s) Administered  . Influenza Split 05/29/2014  . Influenza Whole 05/12/2007, 05/12/2011  . Influenza, High Dose Seasonal PF 05/11/2016, 09/11/2016  . Influenza,inj,Quad PF,36+ Mos 04/25/2013, 05/29/2015  . Influenza-Unspecified 05/11/2012  . Pneumococcal Conjugate-13 08/23/2013  . Pneumococcal Polysaccharide-23 05/12/2007    Past Medical History:  Diagnosis Date  . BPH (benign  prostatic hypertrophy)   . CAD (coronary artery disease) 2003   s/p stent placement ( stent to LAD/D1 and cutting balloon to LCirc marginal)  . Colon polyp   . COPD (chronic obstructive pulmonary disease) (HCC)   . Hypercholesteremia   . Hypertension   . MRSA (methicillin resistant staphylococcus aureus) pneumonia (HCC) 10/2009  . PVC's (premature ventricular contractions)     Tobacco History: History  Smoking Status  . Former Smoker  . Packs/day: 1.00  . Years: 50.00  . Types: Cigarettes  . Quit date: 10/09/2009  Smokeless Tobacco  . Never Used   Counseling given: Not Answered   Outpatient Encounter Prescriptions as of 03/05/2017  Medication Sig  . albuterol (PROVENTIL HFA;VENTOLIN HFA) 108 (90 Base) MCG/ACT inhaler Inhale 2 puffs into the lungs every 6 (six) hours as needed for wheezing or shortness of breath.  Marland Kitchen albuterol (PROVENTIL) (2.5 MG/3ML) 0.083% nebulizer solution Take 3 mLs (2.5 mg total) by nebulization every 6 (six) hours as needed for wheezing or shortness of breath. Reported on 10/02/2015  . aspirin 81 MG tablet Take 81 mg by mouth daily.    Marland Kitchen atorvastatin (LIPITOR) 40 MG tablet Take 1 tablet (40 mg total) by mouth daily at 6 PM.  . guaiFENesin (MUCINEX) 600 MG 12 hr tablet Take 1 tablet (600 mg total) by mouth 2 (two) times daily.  . Tiotropium Bromide-Olodaterol (STIOLTO RESPIMAT) 2.5-2.5 MCG/ACT AERS Inhale 2 puffs into the lungs daily.  . [DISCONTINUED] doxycycline (VIBRA-TABS) 100 MG tablet Take 1 tablet (100 mg total) by mouth every 12 (twelve)  hours. (Patient not taking: Reported on 03/05/2017)  . [DISCONTINUED] predniSONE (DELTASONE) 1 MG tablet Take 5 tablets (5 mg total) by mouth daily with breakfast. (Patient not taking: Reported on 03/05/2017)  . [DISCONTINUED] predniSONE (DELTASONE) 20 MG tablet Take 3 tablets (60 mg total) by mouth daily with breakfast. (Patient not taking: Reported on 03/05/2017)  . [DISCONTINUED] predniSONE (DELTASONE) 20 MG tablet Take 2  tablets (40 mg total) by mouth daily with breakfast. (Patient not taking: Reported on 03/05/2017)  . [DISCONTINUED] predniSONE (DELTASONE) 20 MG tablet Take 1 tablet (20 mg total) by mouth daily with breakfast. (Patient not taking: Reported on 03/05/2017)   No facility-administered encounter medications on file as of 03/05/2017.      Review of Systems  Constitutional:   No  weight loss, night sweats,  Fevers, chills, +fatigue, or  lassitude.  HEENT:   No headaches,  Difficulty swallowing,  Tooth/dental problems, or  Sore throat,                No sneezing, itching, ear ache, nasal congestion, post nasal drip,   CV:  No chest pain,  Orthopnea, PND, swelling in lower extremities, anasarca, dizziness, palpitations, syncope.   GI  No heartburn, indigestion, abdominal pain, nausea, vomiting, diarrhea, change in bowel habits, loss of appetite, bloody stools.   Resp:  No wheezing.  No chest wall deformity  Skin: no rash or lesions.  GU: no dysuria, change in color of urine, no urgency or frequency.  No flank pain, no hematuria   MS:  No joint pain or swelling.  No decreased range of motion.  No back pain.    Physical Exam  BP (!) 108/58 (BP Location: Left Arm, Cuff Size: Normal)   Pulse (!) 107   Ht 5\' 6"  (1.676 m)   Wt 130 lb 6.4 oz (59.1 kg)   SpO2 94%   BMI 21.05 kg/m   GEN: A/Ox3; pleasant , NAD, thin, chronically ill appearing    HEENT:  Emily/AT,  EACs-clear, TMs-wnl, NOSE-clear, THROAT-clear, no lesions, no postnasal drip or exudate noted.   NECK:  Supple w/ fair ROM; no JVD; normal carotid impulses w/o bruits; no thyromegaly or nodules palpated; no lymphadenopathy.    RESP  Clear  P & A; w/o, wheezes/ rales/ or rhonchi. no accessory muscle use, no dullness to percussion  CARD:  RRR, no m/r/g, no peripheral edema, pulses intact, no cyanosis or clubbing.  GI:   Soft & nt; nml bowel sounds; no organomegaly or masses detected.   Musco: Warm bil, no deformities or joint  swelling noted.   Neuro: alert, no focal deficits noted.    Skin: Warm, no lesions or rashes    Lab Results:  CBC  BMET  BNP Imaging: No results found.   Assessment & Plan:   COPD (chronic obstructive pulmonary disease) (HCC) GOLD IV COPD  Slowly resolving flare  Will try low dose steroids for a while to see if this helps.  Steroid pt education given to pt /daughter.   Plan  Patient Instructions  Continue on Stiolto 2 puffs daily . Rinse after use .  Restart Prednisone 20mg  daily for 1 week and 10mg  daily , hold  Follow up Dr. Vassie Loll  In 6 -8 weeks  and As needed   Please contact office for sooner follow up if symptoms do not improve or worsen or seek emergency care         Rubye Oaks, NP 03/05/2017

## 2017-03-05 NOTE — Patient Instructions (Addendum)
Continue on Stiolto 2 puffs daily . Rinse after use .  Restart Prednisone 20mg  daily for 1 week and 10mg  daily , hold  Follow up Dr. Vassie Loll  In 6 -8 weeks  and As needed   Please contact office for sooner follow up if symptoms do not improve or worsen or seek emergency care

## 2017-05-01 ENCOUNTER — Encounter: Payer: Self-pay | Admitting: Pulmonary Disease

## 2017-05-01 ENCOUNTER — Ambulatory Visit (INDEPENDENT_AMBULATORY_CARE_PROVIDER_SITE_OTHER): Payer: PPO | Admitting: Pulmonary Disease

## 2017-05-01 DIAGNOSIS — R918 Other nonspecific abnormal finding of lung field: Secondary | ICD-10-CM | POA: Diagnosis not present

## 2017-05-01 DIAGNOSIS — Z23 Encounter for immunization: Secondary | ICD-10-CM

## 2017-05-01 DIAGNOSIS — J449 Chronic obstructive pulmonary disease, unspecified: Secondary | ICD-10-CM

## 2017-05-01 NOTE — Progress Notes (Signed)
   Subjective:    Patient ID: Jorge [name], male    DOB: September 03, 1940, 76 y.o.   MRN: [id number]  HPI  75/M , heavy ex-smoker with CAD for FU of COPD   Adm 10/2009 for Right upper lobe MRSA pneumonia complicated by rt pneumothorax &persistent air leak  Quit smoking since feb '11.   He was hospitalized for COPD exacerbation 01/2017, chest x-ray reviewed did not show any infiltrates or effusions. Remains on prednisone 10 mg per day, feels his breathing has not returned to baseline. For some reason , insurance would not refill his albuterol rescue inhaler. He used albuterol neb but did not like how this felt. Feels like this felt different from what he got at the hospital He denies sputum production or wheezing. On prior visit, he was placed on trelegy but did not feel like this helped. He is back on stiolto now  Significant tests/ events  CT chest 05/2015 stable COPD changes and nodules.   12/2015 cardiac stress test -w/ EF at 40%. No evidence of ischemia .        Spirometry 02/2017  FEV1 32% at, ratio 29, FVC 79       FEV1 38% in 2008    Review of Systems neg for any significant sore throat, dysphagia, itching, sneezing, nasal congestion or excess/ purulent secretions, fever, chills, sweats, unintended wt loss, pleuritic or exertional cp, hempoptysis, orthopnea pnd or change in chronic leg swelling. Also denies presyncope, palpitations, heartburn, abdominal pain, nausea, vomiting, diarrhea or change in bowel or urinary habits, dysuria,hematuria, rash, arthralgias, visual complaints, headache, numbness weakness or ataxia.     Objective:   Physical Exam   Gen. Pleasant, well-nourished, in no distress ENT - no thrush, no post nasal drip Neck: No JVD, no thyromegaly, no carotid bruits Lungs: no use of accessory muscles, no dullness to percussion, decreased without rales or rhonchi  Cardiovascular: Rhythm regular, heart sounds  normal, no murmurs or gallops, no peripheral  edema Musculoskeletal: No deformities, no cyanosis or clubbing        Assessment & Plan:

## 2017-05-01 NOTE — Patient Instructions (Signed)
Refills on albuterol inhaler Stay on prednisone 10 mg Flu shot

## 2017-05-01 NOTE — Assessment & Plan Note (Signed)
Refills on albuterol inhaler Stay on prednisone 10 mg Ct stiolto Flu shot He will try pulmonary rehabilitation program at [facility]

## 2017-05-01 NOTE — Assessment & Plan Note (Signed)
Stable subcentimeter nodules noted in CT scan from 2016 which have reviewed, likely related to old healed granulomatous infection

## 2017-05-07 ENCOUNTER — Telehealth: Payer: Self-pay | Admitting: Pulmonary Disease

## 2017-05-07 NOTE — Telephone Encounter (Signed)
PA forms have been filled out for the pt proventil HFA and placed in RA look at to be signed.  Will forward to CP to follow up on forms. thanks

## 2017-05-08 NOTE — Telephone Encounter (Signed)
Received approval letter from [facility] that Proventil [facility] inhaler was approved until 08/10/17.

## 2017-06-03 DIAGNOSIS — I208 Other forms of angina pectoris: Secondary | ICD-10-CM | POA: Diagnosis not present

## 2017-06-03 DIAGNOSIS — D692 Other nonthrombocytopenic purpura: Secondary | ICD-10-CM | POA: Diagnosis not present

## 2017-06-03 DIAGNOSIS — E7800 Pure hypercholesterolemia, unspecified: Secondary | ICD-10-CM | POA: Diagnosis not present

## 2017-06-03 DIAGNOSIS — I2510 Atherosclerotic heart disease of native coronary artery without angina pectoris: Secondary | ICD-10-CM | POA: Diagnosis not present

## 2017-06-03 DIAGNOSIS — R7303 Prediabetes: Secondary | ICD-10-CM | POA: Diagnosis not present

## 2017-06-03 DIAGNOSIS — J449 Chronic obstructive pulmonary disease, unspecified: Secondary | ICD-10-CM | POA: Diagnosis not present

## 2017-06-12 ENCOUNTER — Other Ambulatory Visit: Payer: Self-pay | Admitting: Adult Health

## 2017-06-12 ENCOUNTER — Telehealth: Payer: Self-pay | Admitting: Pulmonary Disease

## 2017-06-12 NOTE — Telephone Encounter (Signed)
ATC patient. Received busy tone with each call.   Will attempt to call back later/

## 2017-06-15 MED ORDER — PREDNISONE 10 MG PO TABS: ORAL_TABLET | ORAL | 1 refills | Status: DC

## 2017-06-15 NOTE — Telephone Encounter (Signed)
Called and spoke with pt and he is aware of refill of the pred 10 mg  Has been sent to his pharmacy and he is aware to follow up in December.

## 2017-07-31 ENCOUNTER — Ambulatory Visit: Payer: PPO | Admitting: Adult Health

## 2017-07-31 ENCOUNTER — Encounter: Payer: Self-pay | Admitting: Adult Health

## 2017-07-31 DIAGNOSIS — J449 Chronic obstructive pulmonary disease, unspecified: Secondary | ICD-10-CM

## 2017-07-31 NOTE — Progress Notes (Signed)
 @Patient  ID: Jorge [name], male    DOB: 05/30/1941, 76 y.o.   MRN: [id number]  Chief Complaint  Patient presents with  . Follow-up    COPD     Referring provider: Blair HeysEhinger, Robert, MD  HPI: 76 year old male former smoker followed for COPD Hospital admit 2011 with a right upper lobe MRSA pneumonia compensated by right pneumothorax  TEST  CT chest 01/19/14 - stable Chronic post infectious changes throughout the lungs bilaterally, LUL ground glass opacity, stable compared to 08/2013 CT chest 05/2015 stable COPD changes and nodules.  12/2015 cardiac stress test -w/ EF at 40%. No evidence of ischemia .    07/31/2017 Follow up ; COPD  Patient returns for a 72104-month follow-up.  Patient has very severe COPD , steroid dependent -prednisone 10mg  daily .  He remains on Stiolto daily. Flu and PVX and Prevnar utd.  No flare of cough or wheezing .   Daughter lives with him.     Allergies  Allergen Reactions  . Adenosine Other (See Comments)    Bronchospasms   . Codeine Other (See Comments)    nervousness, ornery  . Erythromycin Nausea Only    Upset stomach  . Sudafed [Pseudoephedrine] Other (See Comments)    Urinary Sx  . Sulfa Antibiotics Rash    Immunization History  Administered Date(s) Administered  . Influenza Split 05/29/2014  . Influenza Whole 05/12/2007, 05/12/2011  . Influenza, High Dose Seasonal PF 05/11/2016, 09/11/2016, 05/01/2017  . Influenza,inj,Quad PF,6+ Mos 04/25/2013, 05/29/2015  . Influenza-Unspecified 05/11/2012  . Pneumococcal Conjugate-13 08/23/2013  . Pneumococcal Polysaccharide-23 05/12/2007    Past Medical History:  Diagnosis Date  . BPH (benign prostatic hypertrophy)   . CAD (coronary artery disease) 2003   s/p stent placement ( stent to LAD/D1 and cutting balloon to LCirc marginal)  . Colon polyp   . COPD (chronic obstructive pulmonary disease) (HCC)   . Hypercholesteremia   . Hypertension   . MRSA (methicillin resistant  staphylococcus aureus) pneumonia (HCC) 10/2009  . PVC's (premature ventricular contractions)     Tobacco History: Social History   Tobacco Use  Smoking Status Former Smoker  . Packs/day: 1.00  . Years: 50.00  . Pack years: 50.00  . Types: Cigarettes  . Last attempt to quit: 10/09/2009  . Years since quitting: 7.8  Smokeless Tobacco Never Used   Counseling given: Not Answered   Outpatient Encounter Medications as of 07/31/2017  Medication Sig  . albuterol (PROVENTIL HFA;VENTOLIN HFA) 108 (90 Base) MCG/ACT inhaler Inhale 2 puffs into the lungs every 6 (six) hours as needed for wheezing or shortness of breath.  Marland Kitchen. albuterol (PROVENTIL) (2.5 MG/3ML) 0.083% nebulizer solution Take 3 mLs (2.5 mg total) by nebulization every 6 (six) hours as needed for wheezing or shortness of breath. Reported on 10/02/2015  . aspirin 81 MG tablet Take 81 mg by mouth daily.    Marland Kitchen. guaiFENesin (MUCINEX) 600 MG 12 hr tablet Take 1 tablet (600 mg total) by mouth 2 (two) times daily.  . predniSONE (DELTASONE) 10 MG tablet 10mg  daily  . Tiotropium Bromide-Olodaterol (STIOLTO RESPIMAT) 2.5-2.5 MCG/ACT AERS Inhale 2 puffs into the lungs daily.   No facility-administered encounter medications on file as of 07/31/2017.      Review of Systems  Constitutional:   No  weight loss, night sweats,  Fevers, chills,  +fatigue, or  lassitude.  HEENT:   No headaches,  Difficulty swallowing,  Tooth/dental problems, or  Sore throat,  No sneezing, itching, ear ache, nasal congestion, post nasal drip,   CV:  No chest pain,  Orthopnea, PND, swelling in lower extremities, anasarca, dizziness, palpitations, syncope.   GI  No heartburn, indigestion, abdominal pain, nausea, vomiting, diarrhea, change in bowel habits, loss of appetite, bloody stools.   Resp:    No excess mucus, no productive cough,  No non-productive cough,  No coughing up of blood.  No change in color of mucus.  No wheezing.  No chest wall  deformity  Skin: no rash or lesions.  GU: no dysuria, change in color of urine, no urgency or frequency.  No flank pain, no hematuria   MS:  No joint pain or swelling.  No decreased range of motion.  No back pain.    Physical Exam  BP 130/68 (BP Location: Right Arm, Cuff Size: Normal)   Pulse (!) 105   Ht 5\' 7"  (1.702 m)   Wt 132 lb 12.8 oz (60.2 kg)   SpO2 93%   BMI 20.80 kg/m   GEN: A/Ox3; pleasant , NAD, elderly    HEENT:  [place]/AT,  EACs-clear, TMs-wnl, NOSE-clear, THROAT-clear, no lesions, no postnasal drip or exudate noted.   NECK:  Supple w/ fair ROM; no JVD; normal carotid impulses w/o bruits; no thyromegaly or nodules palpated; no lymphadenopathy.    RESP  Decreased BS in bases , . no accessory muscle use, no dullness to percussion  CARD:  RRR, no m/r/g, no peripheral edema, pulses intact, no cyanosis or clubbing.  GI:   Soft & nt; nml bowel sounds; no organomegaly or masses detected.   Musco: Warm bil, no deformities or joint swelling noted.   Neuro: alert, no focal deficits noted.    Skin: Warm, no lesions or rashes    Lab Results:  CBC  BNP Imaging: No results found.   Assessment & Plan:   No problem-specific Assessment & Plan notes found for this encounter.     Rubye Oaks, NP 07/31/2017

## 2017-07-31 NOTE — Assessment & Plan Note (Signed)
Stable without flare   Plan  Patient Instructions  Continue on Stiolto 2 puffs daily . Rinse after use .  Continue on Prednisone 10mg  daily  Mucinex DM Twice daily  As needed  Cough/congestion  Follow up Dr. Vassie Loll  In 3 months and As needed   Please contact office for sooner follow up if symptoms do not improve or worsen or seek emergency care

## 2017-07-31 NOTE — Patient Instructions (Signed)
Continue on Stiolto 2 puffs daily . Rinse after use .  Continue on Prednisone 10mg  daily  Mucinex DM Twice daily  As needed  Cough/congestion  Follow up Dr. Vassie Loll  In 3 months and As needed   Please contact office for sooner follow up if symptoms do not improve or worsen or seek emergency care

## 2017-09-01 ENCOUNTER — Telehealth: Payer: Self-pay | Admitting: Pulmonary Disease

## 2017-09-01 MED ORDER — TIOTROPIUM BROMIDE-OLODATEROL 2.5-2.5 MCG/ACT IN AERS: 2.0000 | INHALATION_SPRAY | Freq: Every day | RESPIRATORY_TRACT | 3 refills | Status: DC

## 2017-09-01 MED ORDER — PREDNISONE 10 MG PO TABS: ORAL_TABLET | ORAL | 1 refills | Status: DC

## 2017-09-01 NOTE — Telephone Encounter (Signed)
Spoke with patient. He was requesting a refill on his prednisone to be sent to [facility] and the [facility] to be sent to [place].   Advised patient that I would go ahead and sent in the RXs. He verbalized understanding. Nothing else needed at time of call.

## 2017-09-28 DIAGNOSIS — R197 Diarrhea, unspecified: Secondary | ICD-10-CM | POA: Diagnosis not present

## 2017-09-28 DIAGNOSIS — J9801 Acute bronchospasm: Secondary | ICD-10-CM | POA: Diagnosis not present

## 2017-09-28 DIAGNOSIS — K529 Noninfective gastroenteritis and colitis, unspecified: Secondary | ICD-10-CM | POA: Diagnosis not present

## 2017-09-28 DIAGNOSIS — R112 Nausea with vomiting, unspecified: Secondary | ICD-10-CM | POA: Diagnosis not present

## 2017-09-29 DIAGNOSIS — J209 Acute bronchitis, unspecified: Secondary | ICD-10-CM | POA: Diagnosis not present

## 2017-09-29 DIAGNOSIS — R112 Nausea with vomiting, unspecified: Secondary | ICD-10-CM | POA: Diagnosis not present

## 2017-09-29 DIAGNOSIS — R05 Cough: Secondary | ICD-10-CM | POA: Diagnosis not present

## 2017-09-29 DIAGNOSIS — R197 Diarrhea, unspecified: Secondary | ICD-10-CM | POA: Diagnosis not present

## 2017-09-29 DIAGNOSIS — J189 Pneumonia, unspecified organism: Secondary | ICD-10-CM | POA: Diagnosis not present

## 2017-09-29 DIAGNOSIS — E860 Dehydration: Secondary | ICD-10-CM | POA: Diagnosis not present

## 2017-09-30 DIAGNOSIS — R112 Nausea with vomiting, unspecified: Secondary | ICD-10-CM | POA: Diagnosis not present

## 2017-09-30 DIAGNOSIS — R197 Diarrhea, unspecified: Secondary | ICD-10-CM | POA: Diagnosis not present

## 2017-09-30 DIAGNOSIS — J209 Acute bronchitis, unspecified: Secondary | ICD-10-CM | POA: Diagnosis not present

## 2017-09-30 DIAGNOSIS — J449 Chronic obstructive pulmonary disease, unspecified: Secondary | ICD-10-CM | POA: Diagnosis not present

## 2017-10-08 DIAGNOSIS — H2513 Age-related nuclear cataract, bilateral: Secondary | ICD-10-CM | POA: Diagnosis not present

## 2017-10-08 DIAGNOSIS — H1045 Other chronic allergic conjunctivitis: Secondary | ICD-10-CM | POA: Diagnosis not present

## 2017-10-08 DIAGNOSIS — H02889 Meibomian gland dysfunction of unspecified eye, unspecified eyelid: Secondary | ICD-10-CM | POA: Diagnosis not present

## 2017-10-26 DIAGNOSIS — B029 Zoster without complications: Secondary | ICD-10-CM | POA: Diagnosis not present

## 2017-10-26 DIAGNOSIS — R52 Pain, unspecified: Secondary | ICD-10-CM | POA: Diagnosis not present

## 2017-11-07 DIAGNOSIS — M546 Pain in thoracic spine: Secondary | ICD-10-CM | POA: Diagnosis not present

## 2017-11-07 DIAGNOSIS — R0602 Shortness of breath: Secondary | ICD-10-CM | POA: Diagnosis not present

## 2017-11-07 DIAGNOSIS — J441 Chronic obstructive pulmonary disease with (acute) exacerbation: Secondary | ICD-10-CM | POA: Diagnosis not present

## 2017-11-07 DIAGNOSIS — R000 Tachycardia, unspecified: Secondary | ICD-10-CM | POA: Diagnosis not present

## 2017-11-07 DIAGNOSIS — L219 Seborrheic dermatitis, unspecified: Secondary | ICD-10-CM | POA: Diagnosis not present

## 2017-11-07 DIAGNOSIS — Z885 Allergy status to narcotic agent status: Secondary | ICD-10-CM | POA: Diagnosis not present

## 2017-11-07 DIAGNOSIS — M549 Dorsalgia, unspecified: Secondary | ICD-10-CM | POA: Diagnosis not present

## 2017-11-07 DIAGNOSIS — Z882 Allergy status to sulfonamides status: Secondary | ICD-10-CM | POA: Diagnosis not present

## 2017-11-07 DIAGNOSIS — Z87891 Personal history of nicotine dependence: Secondary | ICD-10-CM | POA: Diagnosis not present

## 2017-11-07 DIAGNOSIS — R0902 Hypoxemia: Secondary | ICD-10-CM | POA: Diagnosis not present

## 2017-11-07 DIAGNOSIS — R0600 Dyspnea, unspecified: Secondary | ICD-10-CM | POA: Diagnosis not present

## 2017-11-07 DIAGNOSIS — I509 Heart failure, unspecified: Secondary | ICD-10-CM | POA: Diagnosis not present

## 2017-11-11 DIAGNOSIS — R0602 Shortness of breath: Secondary | ICD-10-CM | POA: Diagnosis not present

## 2017-11-11 DIAGNOSIS — J449 Chronic obstructive pulmonary disease, unspecified: Secondary | ICD-10-CM | POA: Diagnosis not present

## 2017-11-11 DIAGNOSIS — J209 Acute bronchitis, unspecified: Secondary | ICD-10-CM | POA: Diagnosis not present

## 2017-11-23 DIAGNOSIS — J9801 Acute bronchospasm: Secondary | ICD-10-CM | POA: Diagnosis not present

## 2017-11-23 DIAGNOSIS — J209 Acute bronchitis, unspecified: Secondary | ICD-10-CM | POA: Diagnosis not present

## 2017-12-31 DIAGNOSIS — J9801 Acute bronchospasm: Secondary | ICD-10-CM | POA: Diagnosis not present

## 2017-12-31 DIAGNOSIS — R0602 Shortness of breath: Secondary | ICD-10-CM | POA: Diagnosis not present

## 2017-12-31 DIAGNOSIS — J441 Chronic obstructive pulmonary disease with (acute) exacerbation: Secondary | ICD-10-CM | POA: Diagnosis not present

## 2018-01-19 ENCOUNTER — Ambulatory Visit: Payer: PPO | Admitting: Adult Health

## 2018-01-19 ENCOUNTER — Encounter: Payer: Self-pay | Admitting: Adult Health

## 2018-01-19 ENCOUNTER — Ambulatory Visit (INDEPENDENT_AMBULATORY_CARE_PROVIDER_SITE_OTHER)
Admission: RE | Admit: 2018-01-19 | Discharge: 2018-01-19 | Disposition: A | Discharge status: Home / Self Care | Payer: PPO | Source: Ambulatory Visit | Attending: Adult Health | Admitting: Adult Health

## 2018-01-19 DIAGNOSIS — R0602 Shortness of breath: Secondary | ICD-10-CM | POA: Diagnosis not present

## 2018-01-19 DIAGNOSIS — J441 Chronic obstructive pulmonary disease with (acute) exacerbation: Secondary | ICD-10-CM | POA: Diagnosis not present

## 2018-01-19 DIAGNOSIS — R05 Cough: Secondary | ICD-10-CM | POA: Diagnosis not present

## 2018-01-19 MED ORDER — PREDNISONE 10 MG PO TABS: ORAL_TABLET | ORAL | 5 refills | Status: DC

## 2018-01-19 MED ORDER — TIOTROPIUM BROMIDE-OLODATEROL 2.5-2.5 MCG/ACT IN AERS: 2.0000 | INHALATION_SPRAY | Freq: Every day | RESPIRATORY_TRACT | 5 refills | Status: DC

## 2018-01-19 MED ORDER — ALBUTEROL SULFATE 108 (90 BASE) MCG/ACT IN AEPB: 2.0000 | INHALATION_SPRAY | RESPIRATORY_TRACT | 5 refills | Status: DC | PRN

## 2018-01-19 MED ORDER — DOXYCYCLINE HYCLATE 100 MG PO TABS: 100.0000 mg | ORAL_TABLET | Freq: Two times a day (BID) | ORAL | 0 refills | Status: DC

## 2018-01-19 MED ORDER — PREDNISONE 10 MG PO TABS: ORAL_TABLET | ORAL | 0 refills | Status: DC

## 2018-01-19 NOTE — Addendum Note (Signed)
Addended by: Kerin Ransom on: 01/19/2018 04:38 PM   Modules accepted: Orders

## 2018-01-19 NOTE — Progress Notes (Signed)
@Patient  ID: Jorge [name], male    DOB: 1941/06/16, 77 y.o.   MRN: [id number]  Chief Complaint  Patient presents with  . Acute Visit    COPD     Referring provider: Blair Heys, MD  HPI: 77 yo male former smoker followed for COPD  Past medical history significant for right upper lobe MRSA pneumonia complicated by right pneumothorax  TEST  FEV1 38% in 2008 CT chest 01/19/14 - stable Chronic post infectious changes throughout the lungs bilaterally, LUL ground glass opacity, stable compared to 08/2013 CT chest 05/2015 stable COPD changes and nodules.  12/2015 cardiac stress test -w/ EF at 40%. No evidence of ischemia . June 2018 echo with EF 55 to 60%  01/19/2018 Acute OV : COPD  Patient presents for an acute office visit.  Patient complains of 1 week of increased cough, congestion, wheezing and increased dyspnea.  Remains on Stiolto daily and prednisone 10mg  daily .  Patient denies any hemoptysis chest pain orthopnea PND or increased leg swelling. Says appetite is not been quite as good.  Weight has trended down a few pounds.    Allergies  Allergen Reactions  . Adenosine Other (See Comments)    Bronchospasms   . Codeine Other (See Comments)    nervousness, ornery  . Erythromycin Nausea Only    Upset stomach  . Sudafed [Pseudoephedrine] Other (See Comments)    Urinary Sx  . Sulfa Antibiotics Rash    Immunization History  Administered Date(s) Administered  . Influenza Split 05/29/2014  . Influenza Whole 05/12/2007, 05/12/2011  . Influenza, High Dose Seasonal PF 05/11/2016, 09/11/2016, 05/01/2017  . Influenza,inj,Quad PF,6+ Mos 04/25/2013, 05/29/2015  . Influenza-Unspecified 05/11/2012  . Pneumococcal Conjugate-13 08/23/2013  . Pneumococcal Polysaccharide-23 05/12/2007    Past Medical History:  Diagnosis Date  . BPH (benign prostatic hypertrophy)   . CAD (coronary artery disease) 2003   s/p stent placement ( stent to LAD/D1 and cutting balloon to LCirc  marginal)  . Colon polyp   . COPD (chronic obstructive pulmonary disease) (HCC)   . Hypercholesteremia   . Hypertension   . MRSA (methicillin resistant staphylococcus aureus) pneumonia (HCC) 10/2009  . PVC's (premature ventricular contractions)     Tobacco History: Social History   Tobacco Use  Smoking Status Former Smoker  . Packs/day: 1.00  . Years: 50.00  . Pack years: 50.00  . Types: Cigarettes  . Last attempt to quit: 10/09/2009  . Years since quitting: 8.2  Smokeless Tobacco Never Used   Counseling given: Not Answered   Outpatient Encounter Medications as of 01/19/2018  Medication Sig  . albuterol (PROVENTIL) (2.5 MG/3ML) 0.083% nebulizer solution Take 3 mLs (2.5 mg total) by nebulization every 6 (six) hours as needed for wheezing or shortness of breath. Reported on 10/02/2015  . aspirin 81 MG tablet Take 81 mg by mouth daily.    Marland Kitchen guaiFENesin (MUCINEX) 600 MG 12 hr tablet Take 1 tablet (600 mg total) by mouth 2 (two) times daily.  . predniSONE (DELTASONE) 10 MG tablet 10mg  daily  . Tiotropium Bromide-Olodaterol (STIOLTO RESPIMAT) 2.5-2.5 MCG/ACT AERS Inhale 2 puffs into the lungs daily.  Marland Kitchen albuterol (PROVENTIL HFA;VENTOLIN HFA) 108 (90 Base) MCG/ACT inhaler Inhale 2 puffs into the lungs every 6 (six) hours as needed for wheezing or shortness of breath. (Patient not taking: Reported on 01/19/2018)  . doxycycline (VIBRA-TABS) 100 MG tablet Take 1 tablet (100 mg total) by mouth 2 (two) times daily.  . predniSONE (DELTASONE) 10 MG tablet 4 tabs  for 2 days, then 3 tabs for 2 days, 2 tabs for 2 days, then 1 tab daily   No facility-administered encounter medications on file as of 01/19/2018.      Review of Systems  Constitutional:   No  weight loss, night sweats,  Fevers, chills,  +fatigue, or  lassitude.  HEENT:   No headaches,  Difficulty swallowing,  Tooth/dental problems, or  Sore throat,                No sneezing, itching, ear ache, nasal congestion, post nasal drip,    CV:  No chest pain,  Orthopnea, PND, swelling in lower extremities, anasarca, dizziness, palpitations, syncope.   GI  No heartburn, indigestion, abdominal pain, nausea, vomiting, diarrhea, change in bowel habits, loss of appetite, bloody stools.   Resp:  No chest wall deformity  Skin: no rash or lesions.  GU: no dysuria, change in color of urine, no urgency or frequency.  No flank pain, no hematuria   MS:  No joint pain or swelling.  No decreased range of motion.  No back pain.    Physical Exam  BP 116/84 (BP Location: Left Arm, Cuff Size: Normal)   Ht 5\' 6"  (1.676 m)   Wt 124 lb 12.8 oz (56.6 kg)   SpO2 94%   BMI 20.14 kg/m   GEN: A/Ox3; pleasant , NAD, thin and elderly   HEENT:  [place]/AT,  EACs-clear, TMs-wnl, NOSE-clear, THROAT-clear, no lesions, no postnasal drip or exudate noted.   NECK:  Supple w/ fair ROM; no JVD; normal carotid impulses w/o bruits; no thyromegaly or nodules palpated; no lymphadenopathy.    RESP decreased breath sounds in the bases.  Speaks in full sentences.   no accessory muscle use, no dullness to percussion  CARD:  RRR, no m/r/g, no peripheral edema, pulses intact, no cyanosis or clubbing.  GI:   Soft & nt; nml bowel sounds; no organomegaly or masses detected.   Musco: Warm bil, no deformities or joint swelling noted.   Neuro: alert, no focal deficits noted.    Skin: Warm, no lesions or rashes    Lab Results:  CBC  BMET   Imaging: No results found.   Assessment & Plan:   COPD exacerbation (HCC) Acute COPD exacerbation Check cxr today   plan  Patient Instructions  Doxycycline 100mg  Twice daily  For 7days -take with food.  Increase Prednisone 10mg  4 tabs for 2 days, then 3 tabs for 2 days, 2 tabs for 2 days, then 1 tab daily and hold at this dose.  Continue on Stiolto daily.  Mucinex DM Twice daily  As needed  Cough/congestion  Chest xray today  Follow up Dr. Vassie Loll  In 6 weeks and As needed   Please contact office for  sooner follow up if symptoms do not improve or worsen or seek emergency care          Rubye Oaks, NP 01/19/2018

## 2018-01-19 NOTE — Addendum Note (Signed)
Addended by: Boone Master E on: 01/19/2018 05:32 PM   Modules accepted: Orders

## 2018-01-19 NOTE — Patient Instructions (Addendum)
Doxycycline 100mg  Twice daily  For 7days -take with food.  Increase Prednisone 10mg  4 tabs for 2 days, then 3 tabs for 2 days, 2 tabs for 2 days, then 1 tab daily and hold at this dose.  Continue on Stiolto daily.  Mucinex DM Twice daily  As needed  Cough/congestion  Chest xray today  Follow up Dr. Vassie Loll  In 6 weeks and As needed   Please contact office for sooner follow up if symptoms do not improve or worsen or seek emergency care

## 2018-01-19 NOTE — Assessment & Plan Note (Signed)
Acute COPD exacerbation Check cxr today   plan  Patient Instructions  Doxycycline 100mg  Twice daily  For 7days -take with food.  Increase Prednisone 10mg  4 tabs for 2 days, then 3 tabs for 2 days, 2 tabs for 2 days, then 1 tab daily and hold at this dose.  Continue on Stiolto daily.  Mucinex DM Twice daily  As needed  Cough/congestion  Chest xray today  Follow up Dr. Vassie Loll  In 6 weeks and As needed   Please contact office for sooner follow up if symptoms do not improve or worsen or seek emergency care

## 2018-01-20 ENCOUNTER — Other Ambulatory Visit: Payer: Self-pay | Admitting: *Deleted

## 2018-01-20 MED ORDER — PREDNISONE 10 MG PO TABS: 10.0000 mg | ORAL_TABLET | Freq: Every day | ORAL | 5 refills | Status: AC

## 2018-02-19 ENCOUNTER — Telehealth: Payer: Self-pay | Admitting: Adult Health

## 2018-02-19 MED ORDER — ALBUTEROL SULFATE (2.5 MG/3ML) 0.083% IN NEBU: 2.5000 mg | INHALATION_SOLUTION | Freq: Four times a day (QID) | RESPIRATORY_TRACT | 5 refills | Status: DC | PRN

## 2018-02-19 NOTE — Telephone Encounter (Signed)
Pt called-stated he needs refill of albuterol 0.083% nebulizer. Pt aware of refill sent to [facility] pharmacy as requested.

## 2018-03-02 ENCOUNTER — Ambulatory Visit: Payer: PPO | Admitting: Pulmonary Disease

## 2018-03-02 ENCOUNTER — Encounter: Payer: Self-pay | Admitting: Pulmonary Disease

## 2018-03-02 DIAGNOSIS — J449 Chronic obstructive pulmonary disease, unspecified: Secondary | ICD-10-CM | POA: Diagnosis not present

## 2018-03-02 DIAGNOSIS — J441 Chronic obstructive pulmonary disease with (acute) exacerbation: Secondary | ICD-10-CM | POA: Diagnosis not present

## 2018-03-02 MED ORDER — FLUTICASONE-UMECLIDIN-VILANT 100-62.5-25 MCG/INH IN AEPB: 1.0000 | INHALATION_SPRAY | Freq: Every day | RESPIRATORY_TRACT | 0 refills | Status: DC

## 2018-03-02 MED ORDER — PREDNISONE 10 MG PO TABS: ORAL_TABLET | ORAL | 0 refills | Status: DC

## 2018-03-02 NOTE — Patient Instructions (Addendum)
Prednisone 10 mg tabs Take 4 tabs  daily with food x 2days, then 3 tabs daily x 2 days, then 2 tabs daily x 2 days, then 1 tab daily # 40   Trial of trelegy instead of stiolto - call me for Rx if this works

## 2018-03-02 NOTE — Addendum Note (Signed)
Addended by: Maurene Capes on: 03/02/2018 09:45 AM   Modules accepted: Orders

## 2018-03-02 NOTE — Assessment & Plan Note (Signed)
Trial of trelegy instead of stiolto - call me for Rx if this works  Ct albuterol nebs prn

## 2018-03-02 NOTE — Progress Notes (Signed)
   Subjective:    Patient ID: EON BRAKE, male    DOB: [birth year], 77 y.o.   MRN: [id number]  HPI  77 yo ex- smoker followed for COPD  Past medical history significant for right upper lobe MRSA pneumonia complicated by right pneumothorax  pred taper in 01/2018  Feels worse today with increased wheezing, cough is dry, was out in the hot humid weather yesterday on the roof! Wonders if [employer] can be changed to something else may not be working as well. He likes ProAir Respiclick  Significant tests/ events reviewed  FEV1 38% in 2008 CT chest 01/2014 - stable Chronic post infectious changes throughout the lungs bilaterally, LUL ground glass opacity, stable compared to 08/2013 CT chest 05/2015 stable COPD changes and nodules.  12/2015 cardiac stress test -w/ EF at 40%. No evidence of ischemia . June 2018 echo with EF 55 to 60%   Review of Systems neg for any significant sore throat, dysphagia, itching, sneezing, nasal congestion or excess/ purulent secretions, fever, chills, sweats, unintended wt loss, pleuritic or exertional cp, hempoptysis, orthopnea pnd or change in chronic leg swelling. Also denies presyncope, palpitations, heartburn, abdominal pain, nausea, vomiting, diarrhea or change in bowel or urinary habits, dysuria,hematuria, rash, arthralgias, visual complaints, headache, numbness weakness or ataxia.     Objective:   Physical Exam  Gen. Pleasant, well-nourished, in no distress ENT - no thrush, no post nasal drip Neck: No JVD, no thyromegaly, no carotid bruits Lungs: no use of accessory muscles, no dullness to percussion, decreased BL without rales or rhonchi  Cardiovascular: Rhythm regular, heart sounds  normal, no murmurs or gallops, no peripheral edema Musculoskeletal: No deformities, no cyanosis or clubbing        Assessment & Plan:

## 2018-03-02 NOTE — Addendum Note (Signed)
Addended by: Maurene Capes on: 03/02/2018 09:27 AM   Modules accepted: Orders

## 2018-03-02 NOTE — Assessment & Plan Note (Signed)
Prednisone 10 mg tabs Take 4 tabs  daily with food x 2days, then 3 tabs daily x 2 days, then 2 tabs daily x 2 days, then 1 tab daily # 40

## 2018-03-22 DIAGNOSIS — J9801 Acute bronchospasm: Secondary | ICD-10-CM | POA: Diagnosis not present

## 2018-03-22 DIAGNOSIS — N419 Inflammatory disease of prostate, unspecified: Secondary | ICD-10-CM | POA: Diagnosis not present

## 2018-03-22 DIAGNOSIS — R5383 Other fatigue: Secondary | ICD-10-CM | POA: Diagnosis not present

## 2018-03-22 DIAGNOSIS — E119 Type 2 diabetes mellitus without complications: Secondary | ICD-10-CM | POA: Diagnosis not present

## 2018-03-22 DIAGNOSIS — R0602 Shortness of breath: Secondary | ICD-10-CM | POA: Diagnosis not present

## 2018-03-22 DIAGNOSIS — E039 Hypothyroidism, unspecified: Secondary | ICD-10-CM | POA: Diagnosis not present

## 2018-03-22 DIAGNOSIS — E7800 Pure hypercholesterolemia, unspecified: Secondary | ICD-10-CM | POA: Diagnosis not present

## 2018-03-23 DIAGNOSIS — J9801 Acute bronchospasm: Secondary | ICD-10-CM | POA: Diagnosis not present

## 2018-03-23 DIAGNOSIS — J449 Chronic obstructive pulmonary disease, unspecified: Secondary | ICD-10-CM | POA: Diagnosis not present

## 2018-03-23 DIAGNOSIS — R0602 Shortness of breath: Secondary | ICD-10-CM | POA: Diagnosis not present

## 2018-03-31 DIAGNOSIS — I2510 Atherosclerotic heart disease of native coronary artery without angina pectoris: Secondary | ICD-10-CM | POA: Diagnosis not present

## 2018-03-31 DIAGNOSIS — F329 Major depressive disorder, single episode, unspecified: Secondary | ICD-10-CM | POA: Diagnosis not present

## 2018-03-31 DIAGNOSIS — E785 Hyperlipidemia, unspecified: Secondary | ICD-10-CM | POA: Diagnosis not present

## 2018-03-31 DIAGNOSIS — J449 Chronic obstructive pulmonary disease, unspecified: Secondary | ICD-10-CM | POA: Diagnosis not present

## 2018-03-31 DIAGNOSIS — I208 Other forms of angina pectoris: Secondary | ICD-10-CM | POA: Diagnosis not present

## 2018-04-24 ENCOUNTER — Other Ambulatory Visit: Payer: Self-pay | Admitting: Pulmonary Disease

## 2018-04-28 DIAGNOSIS — F329 Major depressive disorder, single episode, unspecified: Secondary | ICD-10-CM | POA: Diagnosis not present

## 2018-04-28 DIAGNOSIS — E7800 Pure hypercholesterolemia, unspecified: Secondary | ICD-10-CM | POA: Diagnosis not present

## 2018-04-28 DIAGNOSIS — J449 Chronic obstructive pulmonary disease, unspecified: Secondary | ICD-10-CM | POA: Diagnosis not present

## 2018-04-28 DIAGNOSIS — I208 Other forms of angina pectoris: Secondary | ICD-10-CM | POA: Diagnosis not present

## 2018-04-28 DIAGNOSIS — I2510 Atherosclerotic heart disease of native coronary artery without angina pectoris: Secondary | ICD-10-CM | POA: Diagnosis not present

## 2018-04-29 DIAGNOSIS — D649 Anemia, unspecified: Secondary | ICD-10-CM | POA: Diagnosis not present

## 2018-04-29 DIAGNOSIS — J449 Chronic obstructive pulmonary disease, unspecified: Secondary | ICD-10-CM | POA: Diagnosis not present

## 2018-04-29 DIAGNOSIS — J9600 Acute respiratory failure, unspecified whether with hypoxia or hypercapnia: Secondary | ICD-10-CM | POA: Diagnosis not present

## 2018-05-11 DIAGNOSIS — R0602 Shortness of breath: Secondary | ICD-10-CM | POA: Diagnosis not present

## 2018-05-11 DIAGNOSIS — R609 Edema, unspecified: Secondary | ICD-10-CM | POA: Diagnosis not present

## 2018-06-01 ENCOUNTER — Other Ambulatory Visit: Payer: Self-pay | Admitting: Pulmonary Disease

## 2018-06-02 ENCOUNTER — Ambulatory Visit: Payer: PPO | Admitting: Adult Health

## 2018-06-02 ENCOUNTER — Encounter: Payer: Self-pay | Admitting: Adult Health

## 2018-06-02 VITALS — BP 136/70 | HR 102 | Ht 66.0 in | Wt 130.4 lb

## 2018-06-02 DIAGNOSIS — J432 Centrilobular emphysema: Secondary | ICD-10-CM

## 2018-06-02 DIAGNOSIS — Z23 Encounter for immunization: Secondary | ICD-10-CM

## 2018-06-02 DIAGNOSIS — J449 Chronic obstructive pulmonary disease, unspecified: Secondary | ICD-10-CM | POA: Diagnosis not present

## 2018-06-02 NOTE — Patient Instructions (Signed)
Continue on TRELEGY , rinse after use.  Mucinex DM Twice daily  As needed  Cough/congestion  Refer to Pulmonary Rehab .  Ensure between meals Twice daily  .  Flu shot .  Follow up Dr. Vassie Loll  Or Parrett NP in 3-4 months and As needed   Please contact office for sooner follow up if symptoms do not improve or worsen or seek emergency care

## 2018-06-02 NOTE — Progress Notes (Signed)
@Patient  ID: Jorge [name], male    DOB: 20-Feb-1941, 77 y.o.   MRN: [id number]  Chief Complaint  Patient presents with  . Follow-up    COPD     Referring provider: Blair Heys, MD  HPI: 77 yo male former smoker followed for COPD  Past medical history significant for right upper lobe MRSA pneumonia complicated by right pneumothorax  TEST  FEV1 38% in 2008 CT chest 01/19/14 - stable Chronic post infectious changes throughout the lungs bilaterally, LUL ground glass opacity, stable compared to 08/2013 CT chest 05/2015 stable COPD changes and nodules.  12/2015 cardiac stress test -w/ EF at 40%. No evidence of ischemia . June 2018 echo with EF 55 to 60%   06/02/2018 Follow up : COPD  Patient presents for a 63-month follow-up.  Patient has underlying severe COPD. He remains on Trelegy . Was changed from Stiolto to [facility] last ov, says this is working well.  He had a flare of his COPD last visit was given prednisone.  States that symptoms did get better and back to his baseline.  At baseline he is short of breath with minimal activity.  He denies any flare of cough or wheezing.  He denies any chest pain orthopnea PND or increased leg swelling.  Chest x-ray June 2019 showed stable bullous COPD and emphysema. Not on Oxygen , o2 sats today on room air 97%.  Drinks ensure 1-2 day .     Allergies  Allergen Reactions  . Adenosine Other (See Comments)    Bronchospasms   . Codeine Other (See Comments)    nervousness, ornery  . Erythromycin Nausea Only    Upset stomach  . Sudafed [Pseudoephedrine] Other (See Comments)    Urinary Sx  . Sulfa Antibiotics Rash    Immunization History  Administered Date(s) Administered  . Influenza Split 05/29/2014  . Influenza Whole 05/12/2007, 05/12/2011  . Influenza, High Dose Seasonal PF 05/11/2016, 09/11/2016, 05/01/2017  . Influenza,inj,Quad PF,6+ Mos 04/25/2013, 05/29/2015  . Influenza-Unspecified 05/11/2012  . Pneumococcal  Conjugate-13 08/23/2013  . Pneumococcal Polysaccharide-23 05/12/2007    Past Medical History:  Diagnosis Date  . BPH (benign prostatic hypertrophy)   . CAD (coronary artery disease) 2003   s/p stent placement ( stent to LAD/D1 and cutting balloon to LCirc marginal)  . Colon polyp   . COPD (chronic obstructive pulmonary disease) (HCC)   . Hypercholesteremia   . Hypertension   . MRSA (methicillin resistant staphylococcus aureus) pneumonia (HCC) 10/2009  . PVC's (premature ventricular contractions)     Tobacco History: Social History   Tobacco Use  Smoking Status Former Smoker  . Packs/day: 1.00  . Years: 50.00  . Pack years: 50.00  . Types: Cigarettes  . Last attempt to quit: 10/09/2009  . Years since quitting: 8.6  Smokeless Tobacco Never Used   Counseling given: Not Answered   Outpatient Medications Prior to Visit  Medication Sig Dispense Refill  . albuterol (PROVENTIL) (2.5 MG/3ML) 0.083% nebulizer solution Take 3 mLs (2.5 mg total) by nebulization every 6 (six) hours as needed for wheezing or shortness of breath. Reported on 10/02/2015 75 mL 5  . aspirin 81 MG tablet Take 81 mg by mouth daily.      Marland Kitchen guaiFENesin (MUCINEX) 600 MG 12 hr tablet Take 1 tablet (600 mg total) by mouth 2 (two) times daily. 30 tablet 0  . predniSONE (DELTASONE) 10 MG tablet Take 1 tablet (10 mg total) by mouth daily with breakfast. 30 tablet 5  .  PROAIR RESPICLICK 108 (90 Base) MCG/ACT AEPB INHALE 2 PUFFS EVERY 4 TO 6 HOURS AS NEEDED FOR SHORTNESS OF BREATH OR WHEEZING. 3 each 0  . TRELEGY ELLIPTA 100-62.5-25 MCG/INH AEPB INHALE 1 PUFF BY MOUTH DAILY. RINSE MOUTH AFTER USE. 60 each 5  . predniSONE (DELTASONE) 10 MG tablet Take 4 tabs for 2 days, then 3 tabs for 2 days, 2 tabs for 2 days, then 1 tab for 2 days, then stop. 20 tablet 0  . Tiotropium Bromide-Olodaterol (STIOLTO RESPIMAT) 2.5-2.5 MCG/ACT AERS Inhale 2 puffs into the lungs daily. 1 Inhaler 5   No facility-administered medications prior to  visit.      Review of Systems  Constitutional:   No  weight loss, night sweats,  Fevers, chills, fatigue, or  lassitude.  HEENT:   No headaches,  Difficulty swallowing,  Tooth/dental problems, or  Sore throat,                No sneezing, itching, ear ache, nasal congestion, post nasal drip,   CV:  No chest pain,  Orthopnea, PND, swelling in lower extremities, anasarca, dizziness, palpitations, syncope.   GI  No heartburn, indigestion, abdominal pain, nausea, vomiting, diarrhea, change in bowel habits, loss of appetite, bloody stools.   Resp: No shortness of breath with exertion or at rest.  No excess mucus, no productive cough,  No non-productive cough,  No coughing up of blood.  No change in color of mucus.  No wheezing.  No chest wall deformity  Skin: no rash or lesions.  GU: no dysuria, change in color of urine, no urgency or frequency.  No flank pain, no hematuria   MS:  No joint pain or swelling.  No decreased range of motion.  No back pain.    Physical Exam  BP 136/70 (BP Location: Left Arm, Cuff Size: Normal)   Pulse (!) 102   Ht 5\' 6"  (1.676 m)   Wt 130 lb 6.4 oz (59.1 kg)   SpO2 97%   BMI 21.05 kg/m   GEN: A/Ox3; pleasant , NAD, thin and elderly, frail    HEENT:  [place]/AT,  EACs-clear, TMs-wnl, NOSE-clear, THROAT-clear, no lesions, no postnasal drip or exudate noted.   NECK:  Supple w/ fair ROM; no JVD; normal carotid impulses w/o bruits; no thyromegaly or nodules palpated; no lymphadenopathy.    RESP  Decreased BS in bases no accessory muscle use, no dullness to percussion  CARD:  RRR, no m/r/g, no peripheral edema, pulses intact, no cyanosis or clubbing.  GI:   Soft & nt; nml bowel sounds; no organomegaly or masses detected.   Musco: Warm bil, no deformities or joint swelling noted.   Neuro: alert, no focal deficits noted.    Skin: Warm, no lesions or rashes    Lab Results:  CBC   BNP  Imaging: No results found.   Assessment & Plan:   COPD  (chronic obstructive pulmonary disease) (HCC) Severe COPD with deconditioning Flu shot today Referral to pulmonary rehab   Plan  Patient Instructions  Continue on TRELEGY , rinse after use.  Mucinex DM Twice daily  As needed  Cough/congestion  Refer to Pulmonary Rehab .  Ensure between meals Twice daily  .  Flu shot .  Follow up Dr. Vassie Loll  Or Parrett NP in 3-4 months and As needed   Please contact office for sooner follow up if symptoms do not improve or worsen or seek emergency care  Rubye Oaks, NP 06/02/2018

## 2018-06-02 NOTE — Addendum Note (Signed)
Addended by: Boone Master E on: 06/02/2018 12:49 PM   Modules accepted: Orders

## 2018-06-02 NOTE — Assessment & Plan Note (Signed)
Severe COPD with deconditioning Flu shot today Referral to pulmonary rehab   Plan  Patient Instructions  Continue on TRELEGY , rinse after use.  Mucinex DM Twice daily  As needed  Cough/congestion  Refer to Pulmonary Rehab .  Ensure between meals Twice daily  .  Flu shot .  Follow up Dr. Vassie Loll  Or Parrett NP in 3-4 months and As needed   Please contact office for sooner follow up if symptoms do not improve or worsen or seek emergency care

## 2018-06-02 NOTE — Addendum Note (Signed)
Addended by: Tana Felts on: 06/02/2018 09:28 AM   Modules accepted: Orders

## 2018-06-04 ENCOUNTER — Telehealth (HOSPITAL_COMMUNITY): Payer: Self-pay

## 2018-06-04 NOTE — Telephone Encounter (Signed)
Pt insurance is active and benefits verified through [employer]. Co-pay $15.00, DED $0.00/$0.00 met, out of pocket $3,400.00/$60.00 met, co-insurance 0%. No pre-authorization. Demetrius/[employer], 06/04/18 @ 10:55AM, REF# [id number]

## 2018-06-04 NOTE — Telephone Encounter (Signed)
Called patient to see if he was interested in participating in the Pulmonary Rehab Program. Patient stated yes. Patient will come in for orientation on 07/12/18 @ 11AM and will attend the 1:30PM exercise class.  Mailed homework package.  went over insurance, patient verbalized understanding.

## 2018-06-08 DIAGNOSIS — J449 Chronic obstructive pulmonary disease, unspecified: Secondary | ICD-10-CM | POA: Diagnosis not present

## 2018-06-08 DIAGNOSIS — E870 Hyperosmolality and hypernatremia: Secondary | ICD-10-CM | POA: Diagnosis not present

## 2018-06-08 DIAGNOSIS — R7303 Prediabetes: Secondary | ICD-10-CM | POA: Diagnosis not present

## 2018-06-08 DIAGNOSIS — I208 Other forms of angina pectoris: Secondary | ICD-10-CM | POA: Diagnosis not present

## 2018-06-08 DIAGNOSIS — F411 Generalized anxiety disorder: Secondary | ICD-10-CM | POA: Diagnosis not present

## 2018-06-08 DIAGNOSIS — E7800 Pure hypercholesterolemia, unspecified: Secondary | ICD-10-CM | POA: Diagnosis not present

## 2018-06-08 DIAGNOSIS — R54 Age-related physical debility: Secondary | ICD-10-CM | POA: Diagnosis not present

## 2018-06-08 DIAGNOSIS — I2510 Atherosclerotic heart disease of native coronary artery without angina pectoris: Secondary | ICD-10-CM | POA: Diagnosis not present

## 2018-06-08 DIAGNOSIS — D692 Other nonthrombocytopenic purpura: Secondary | ICD-10-CM | POA: Diagnosis not present

## 2018-06-25 DIAGNOSIS — J9801 Acute bronchospasm: Secondary | ICD-10-CM | POA: Diagnosis not present

## 2018-06-25 DIAGNOSIS — J020 Streptococcal pharyngitis: Secondary | ICD-10-CM | POA: Diagnosis not present

## 2018-06-25 DIAGNOSIS — J209 Acute bronchitis, unspecified: Secondary | ICD-10-CM | POA: Diagnosis not present

## 2018-06-25 DIAGNOSIS — J449 Chronic obstructive pulmonary disease, unspecified: Secondary | ICD-10-CM | POA: Diagnosis not present

## 2018-07-05 DIAGNOSIS — J209 Acute bronchitis, unspecified: Secondary | ICD-10-CM | POA: Diagnosis not present

## 2018-07-05 DIAGNOSIS — J020 Streptococcal pharyngitis: Secondary | ICD-10-CM | POA: Diagnosis not present

## 2018-07-05 DIAGNOSIS — J9801 Acute bronchospasm: Secondary | ICD-10-CM | POA: Diagnosis not present

## 2018-07-05 DIAGNOSIS — J449 Chronic obstructive pulmonary disease, unspecified: Secondary | ICD-10-CM | POA: Diagnosis not present

## 2018-07-06 ENCOUNTER — Telehealth (HOSPITAL_COMMUNITY): Payer: Self-pay

## 2018-07-06 DIAGNOSIS — I21A1 Myocardial infarction type 2: Secondary | ICD-10-CM | POA: Diagnosis not present

## 2018-07-06 DIAGNOSIS — J181 Lobar pneumonia, unspecified organism: Secondary | ICD-10-CM | POA: Diagnosis not present

## 2018-07-06 DIAGNOSIS — R590 Localized enlarged lymph nodes: Secondary | ICD-10-CM | POA: Diagnosis not present

## 2018-07-06 DIAGNOSIS — J440 Chronic obstructive pulmonary disease with acute lower respiratory infection: Secondary | ICD-10-CM | POA: Diagnosis not present

## 2018-07-06 DIAGNOSIS — Z7982 Long term (current) use of aspirin: Secondary | ICD-10-CM | POA: Diagnosis not present

## 2018-07-06 DIAGNOSIS — J449 Chronic obstructive pulmonary disease, unspecified: Secondary | ICD-10-CM | POA: Diagnosis not present

## 2018-07-06 DIAGNOSIS — R7989 Other specified abnormal findings of blood chemistry: Secondary | ICD-10-CM | POA: Diagnosis not present

## 2018-07-06 DIAGNOSIS — Z87891 Personal history of nicotine dependence: Secondary | ICD-10-CM | POA: Diagnosis not present

## 2018-07-06 DIAGNOSIS — R0603 Acute respiratory distress: Secondary | ICD-10-CM | POA: Diagnosis not present

## 2018-07-06 DIAGNOSIS — R000 Tachycardia, unspecified: Secondary | ICD-10-CM | POA: Diagnosis not present

## 2018-07-06 DIAGNOSIS — M47814 Spondylosis without myelopathy or radiculopathy, thoracic region: Secondary | ICD-10-CM | POA: Diagnosis not present

## 2018-07-06 DIAGNOSIS — I509 Heart failure, unspecified: Secondary | ICD-10-CM | POA: Diagnosis not present

## 2018-07-06 DIAGNOSIS — E869 Volume depletion, unspecified: Secondary | ICD-10-CM | POA: Diagnosis not present

## 2018-07-06 DIAGNOSIS — J020 Streptococcal pharyngitis: Secondary | ICD-10-CM | POA: Diagnosis not present

## 2018-07-06 DIAGNOSIS — I2510 Atherosclerotic heart disease of native coronary artery without angina pectoris: Secondary | ICD-10-CM | POA: Diagnosis not present

## 2018-07-06 DIAGNOSIS — R918 Other nonspecific abnormal finding of lung field: Secondary | ICD-10-CM | POA: Diagnosis not present

## 2018-07-06 DIAGNOSIS — J209 Acute bronchitis, unspecified: Secondary | ICD-10-CM | POA: Diagnosis not present

## 2018-07-06 DIAGNOSIS — I700 Atherosclerosis of aorta: Secondary | ICD-10-CM | POA: Diagnosis not present

## 2018-07-06 DIAGNOSIS — R0902 Hypoxemia: Secondary | ICD-10-CM | POA: Diagnosis not present

## 2018-07-06 DIAGNOSIS — Z882 Allergy status to sulfonamides status: Secondary | ICD-10-CM | POA: Diagnosis not present

## 2018-07-06 DIAGNOSIS — R0602 Shortness of breath: Secondary | ICD-10-CM | POA: Diagnosis not present

## 2018-07-06 DIAGNOSIS — T17990A Other foreign object in respiratory tract, part unspecified in causing asphyxiation, initial encounter: Secondary | ICD-10-CM | POA: Diagnosis not present

## 2018-07-06 DIAGNOSIS — R509 Fever, unspecified: Secondary | ICD-10-CM | POA: Diagnosis not present

## 2018-07-06 DIAGNOSIS — E7800 Pure hypercholesterolemia, unspecified: Secondary | ICD-10-CM | POA: Diagnosis not present

## 2018-07-06 DIAGNOSIS — R05 Cough: Secondary | ICD-10-CM | POA: Diagnosis not present

## 2018-07-06 DIAGNOSIS — Z9049 Acquired absence of other specified parts of digestive tract: Secondary | ICD-10-CM | POA: Diagnosis not present

## 2018-07-07 DIAGNOSIS — R0902 Hypoxemia: Secondary | ICD-10-CM | POA: Diagnosis not present

## 2018-07-07 DIAGNOSIS — R000 Tachycardia, unspecified: Secondary | ICD-10-CM | POA: Diagnosis not present

## 2018-07-12 ENCOUNTER — Telehealth (HOSPITAL_COMMUNITY): Payer: Self-pay | Admitting: Family Medicine

## 2018-07-12 ENCOUNTER — Ambulatory Visit (HOSPITAL_COMMUNITY): Payer: PPO

## 2018-07-15 DIAGNOSIS — R0603 Acute respiratory distress: Secondary | ICD-10-CM | POA: Diagnosis not present

## 2018-07-15 DIAGNOSIS — R0602 Shortness of breath: Secondary | ICD-10-CM | POA: Diagnosis not present

## 2018-07-15 DIAGNOSIS — J189 Pneumonia, unspecified organism: Secondary | ICD-10-CM | POA: Diagnosis not present

## 2018-07-16 DIAGNOSIS — R0602 Shortness of breath: Secondary | ICD-10-CM | POA: Diagnosis not present

## 2018-07-16 DIAGNOSIS — J189 Pneumonia, unspecified organism: Secondary | ICD-10-CM | POA: Diagnosis not present

## 2018-07-16 DIAGNOSIS — R0603 Acute respiratory distress: Secondary | ICD-10-CM | POA: Diagnosis not present

## 2018-07-22 DIAGNOSIS — J189 Pneumonia, unspecified organism: Secondary | ICD-10-CM | POA: Diagnosis not present

## 2018-07-22 DIAGNOSIS — R062 Wheezing: Secondary | ICD-10-CM | POA: Diagnosis not present

## 2018-07-22 DIAGNOSIS — J440 Chronic obstructive pulmonary disease with acute lower respiratory infection: Secondary | ICD-10-CM | POA: Diagnosis not present

## 2018-07-22 DIAGNOSIS — R609 Edema, unspecified: Secondary | ICD-10-CM | POA: Diagnosis not present

## 2018-07-29 DIAGNOSIS — R54 Age-related physical debility: Secondary | ICD-10-CM | POA: Diagnosis not present

## 2018-07-29 DIAGNOSIS — J449 Chronic obstructive pulmonary disease, unspecified: Secondary | ICD-10-CM | POA: Diagnosis not present

## 2018-07-29 DIAGNOSIS — I2510 Atherosclerotic heart disease of native coronary artery without angina pectoris: Secondary | ICD-10-CM | POA: Diagnosis not present

## 2018-07-29 DIAGNOSIS — I208 Other forms of angina pectoris: Secondary | ICD-10-CM | POA: Diagnosis not present

## 2018-07-29 DIAGNOSIS — F329 Major depressive disorder, single episode, unspecified: Secondary | ICD-10-CM | POA: Diagnosis not present

## 2018-08-02 IMAGING — DX DG CHEST 1V PORT
2 series · 2 of 2 positions shown · non-contrast
Comparison: 01/08/2017.  08/23/2013.

CLINICAL DATA: Shortness of breath.

EXAM:
PORTABLE CHEST 1 VIEW

[chest ap (1 of 2)]
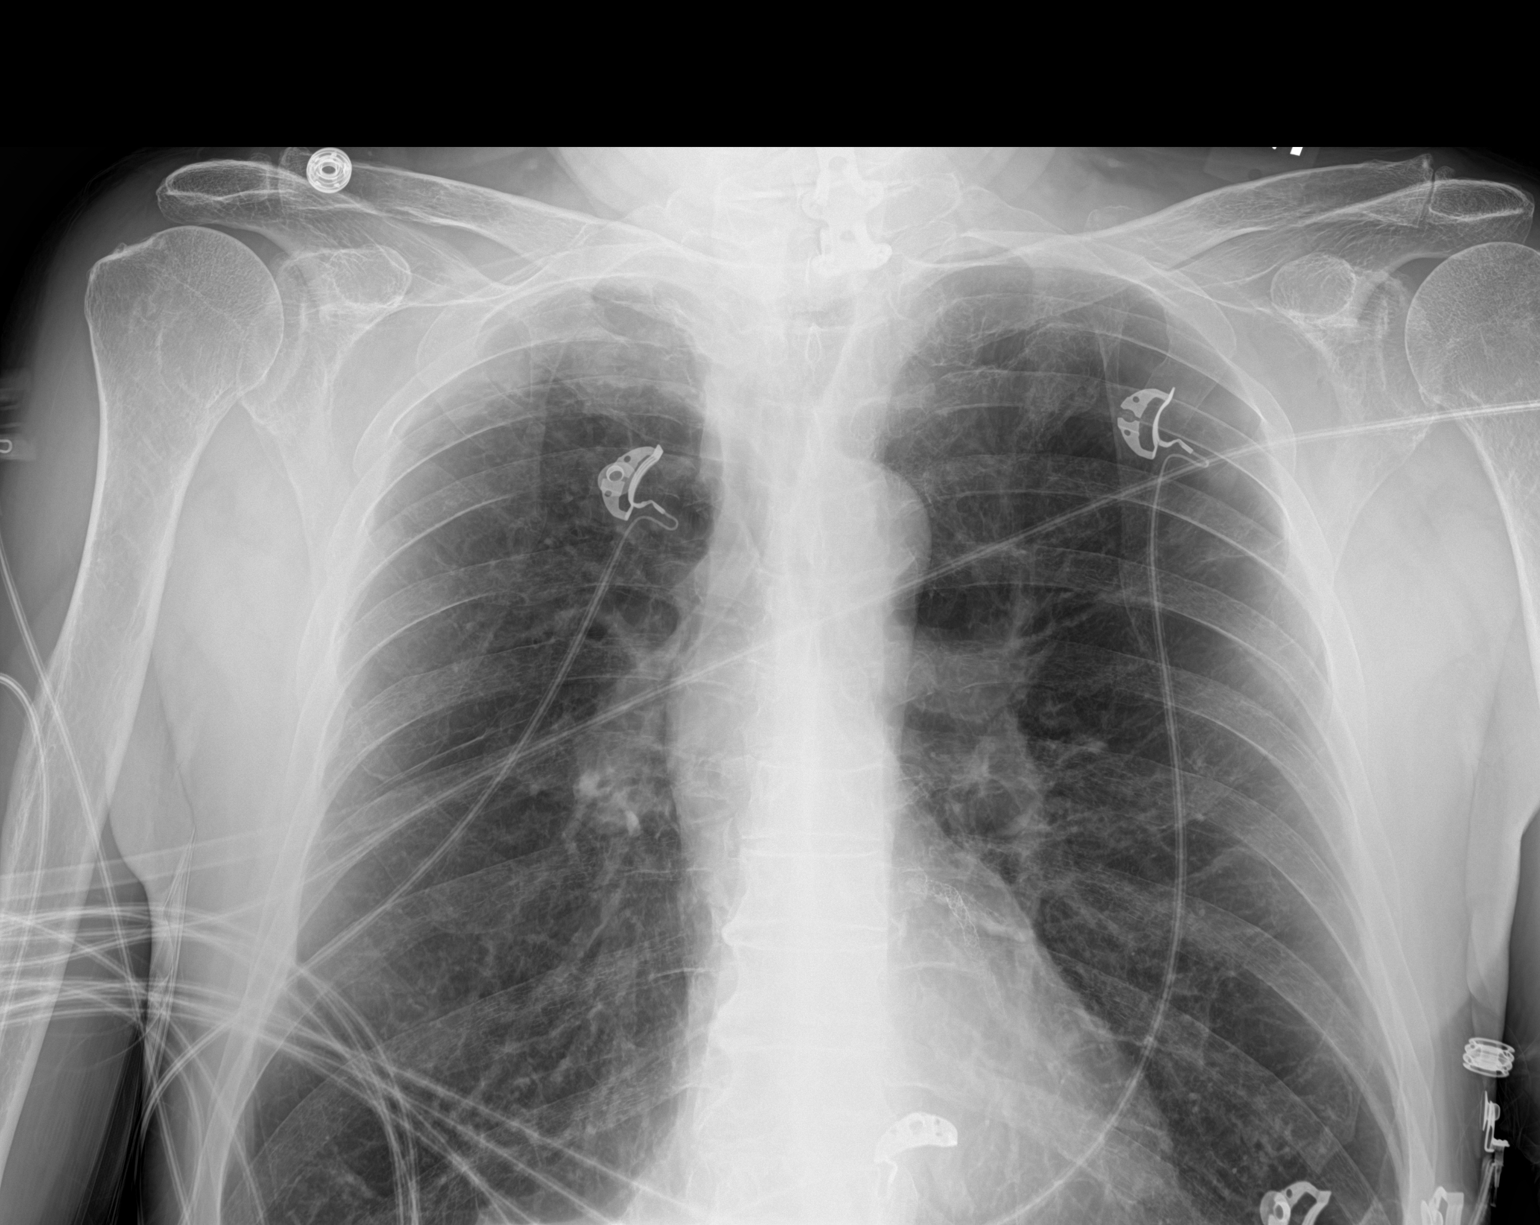

[chest ap (2 of 2)]
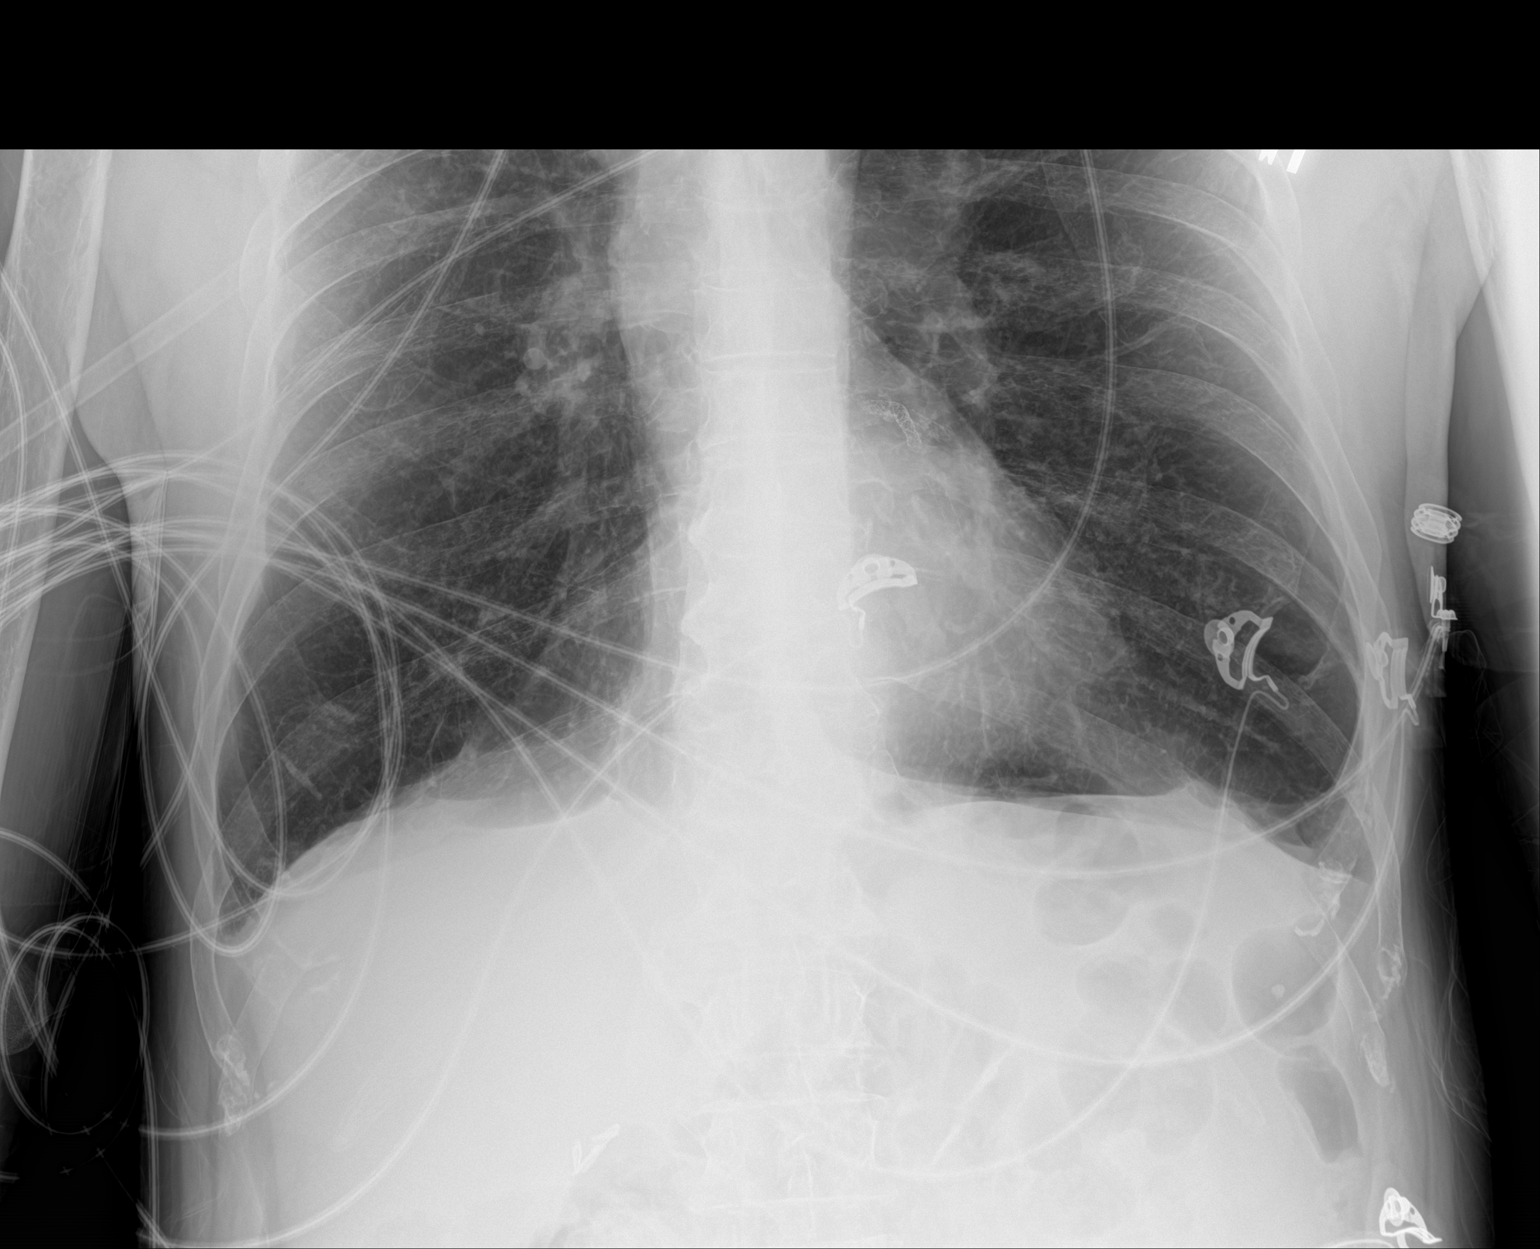

[2 of 2 positions shown; findings below may reference images not displayed]

FINDINGS: Mediastinum and hilar structures are normal. Coronary stents noted.
Heart size normal. COPD. Right apical and bibasal pleuroparenchymal
thickening consistent with scarring again noted. No acute bony
abnormality. Prior cervicothoracic spine fusion. Degenerative
changes thoracic spine. Surgical clips right upper quadrant.
IMPRESSION: 1. Right apical lung bibasal pleuroparenchymal thickening consistent
with scarring.

2. COPD.  No acute abnormality.

## 2018-08-03 IMAGING — CR DG CHEST 2V
2 series · 2 of 2 positions shown · non-contrast
Comparison: Chest x-ray 01/29/2017, 02/28/2015, 08/23/2013

CLINICAL DATA: COPD.

EXAM:
CHEST  2 VIEW

[chest pa]
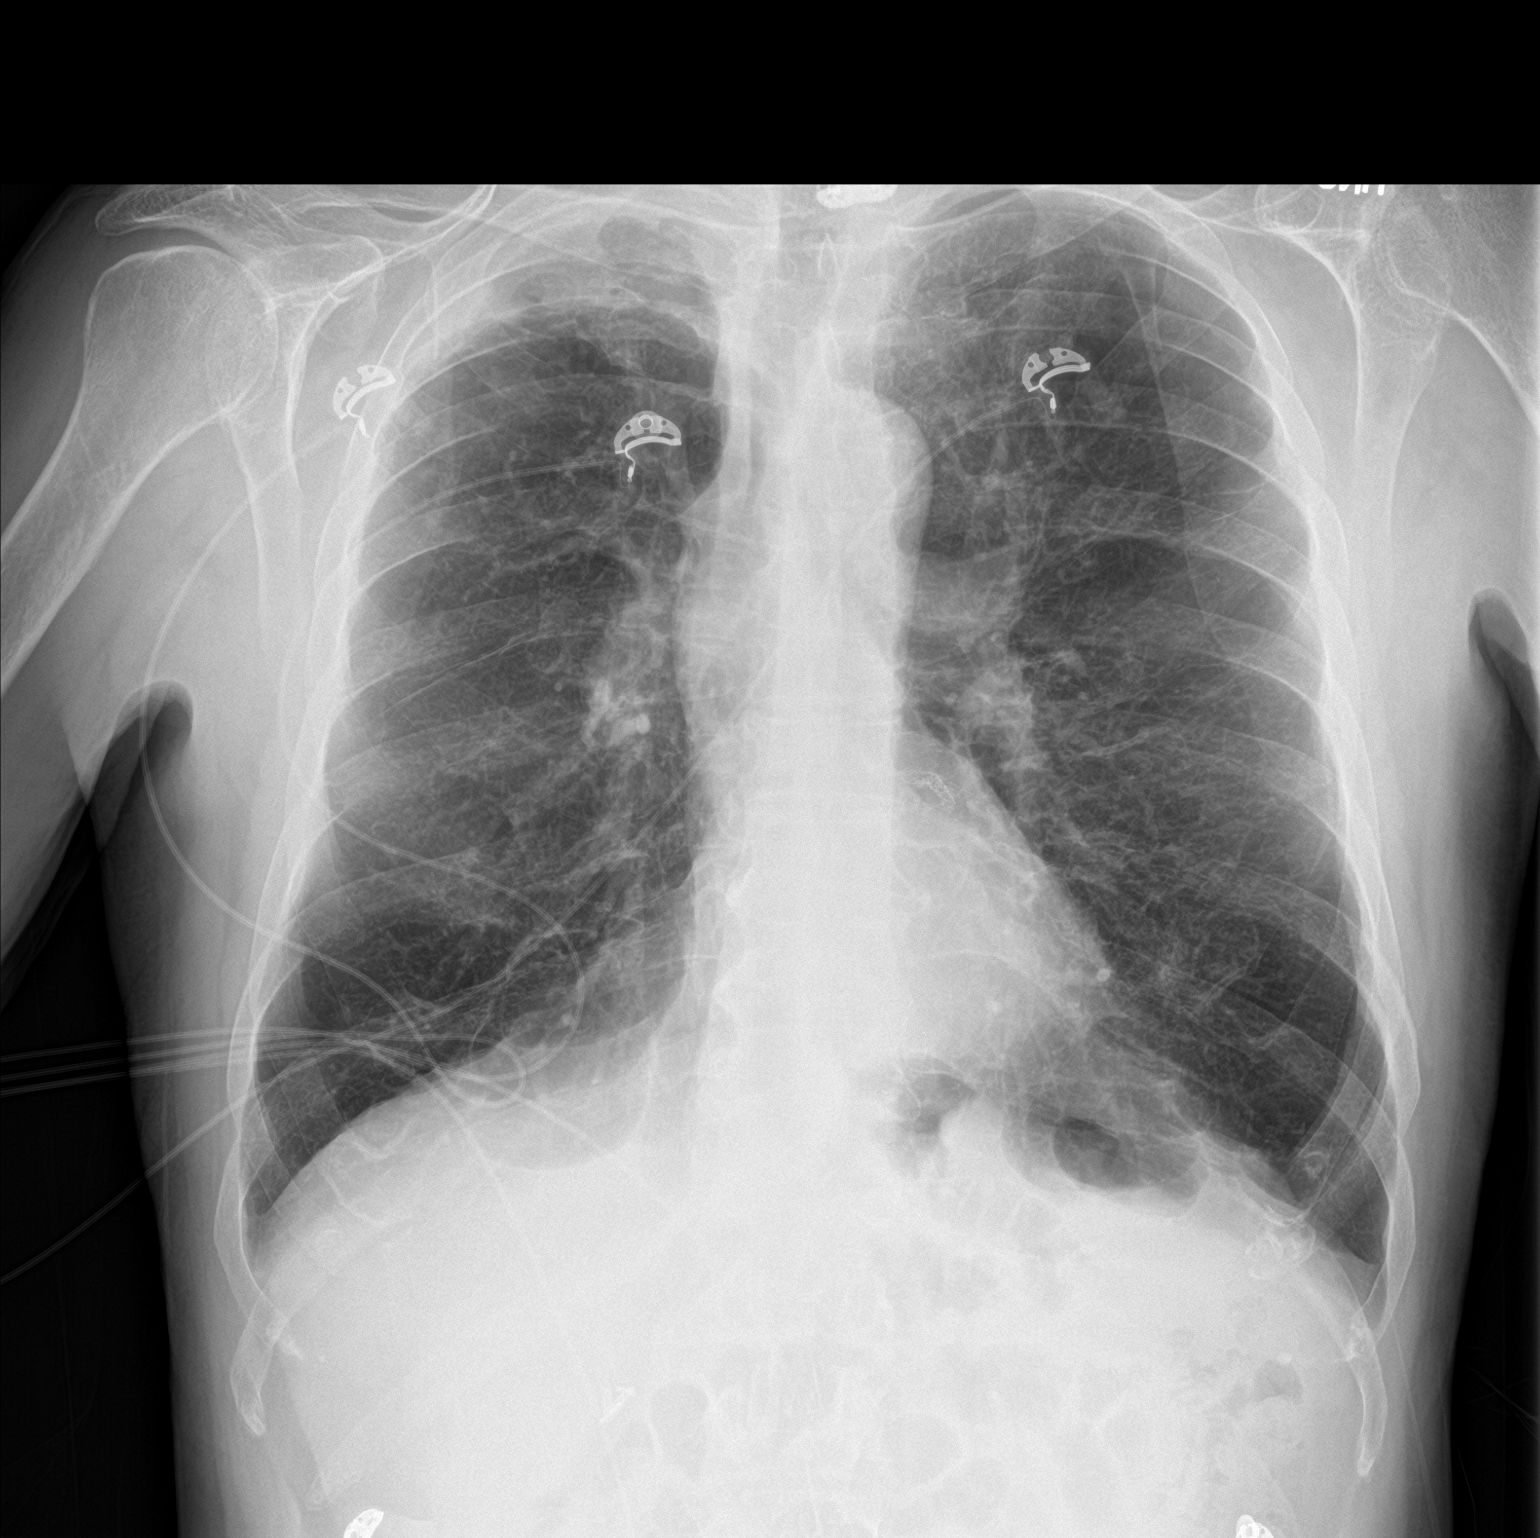

[chest lat]
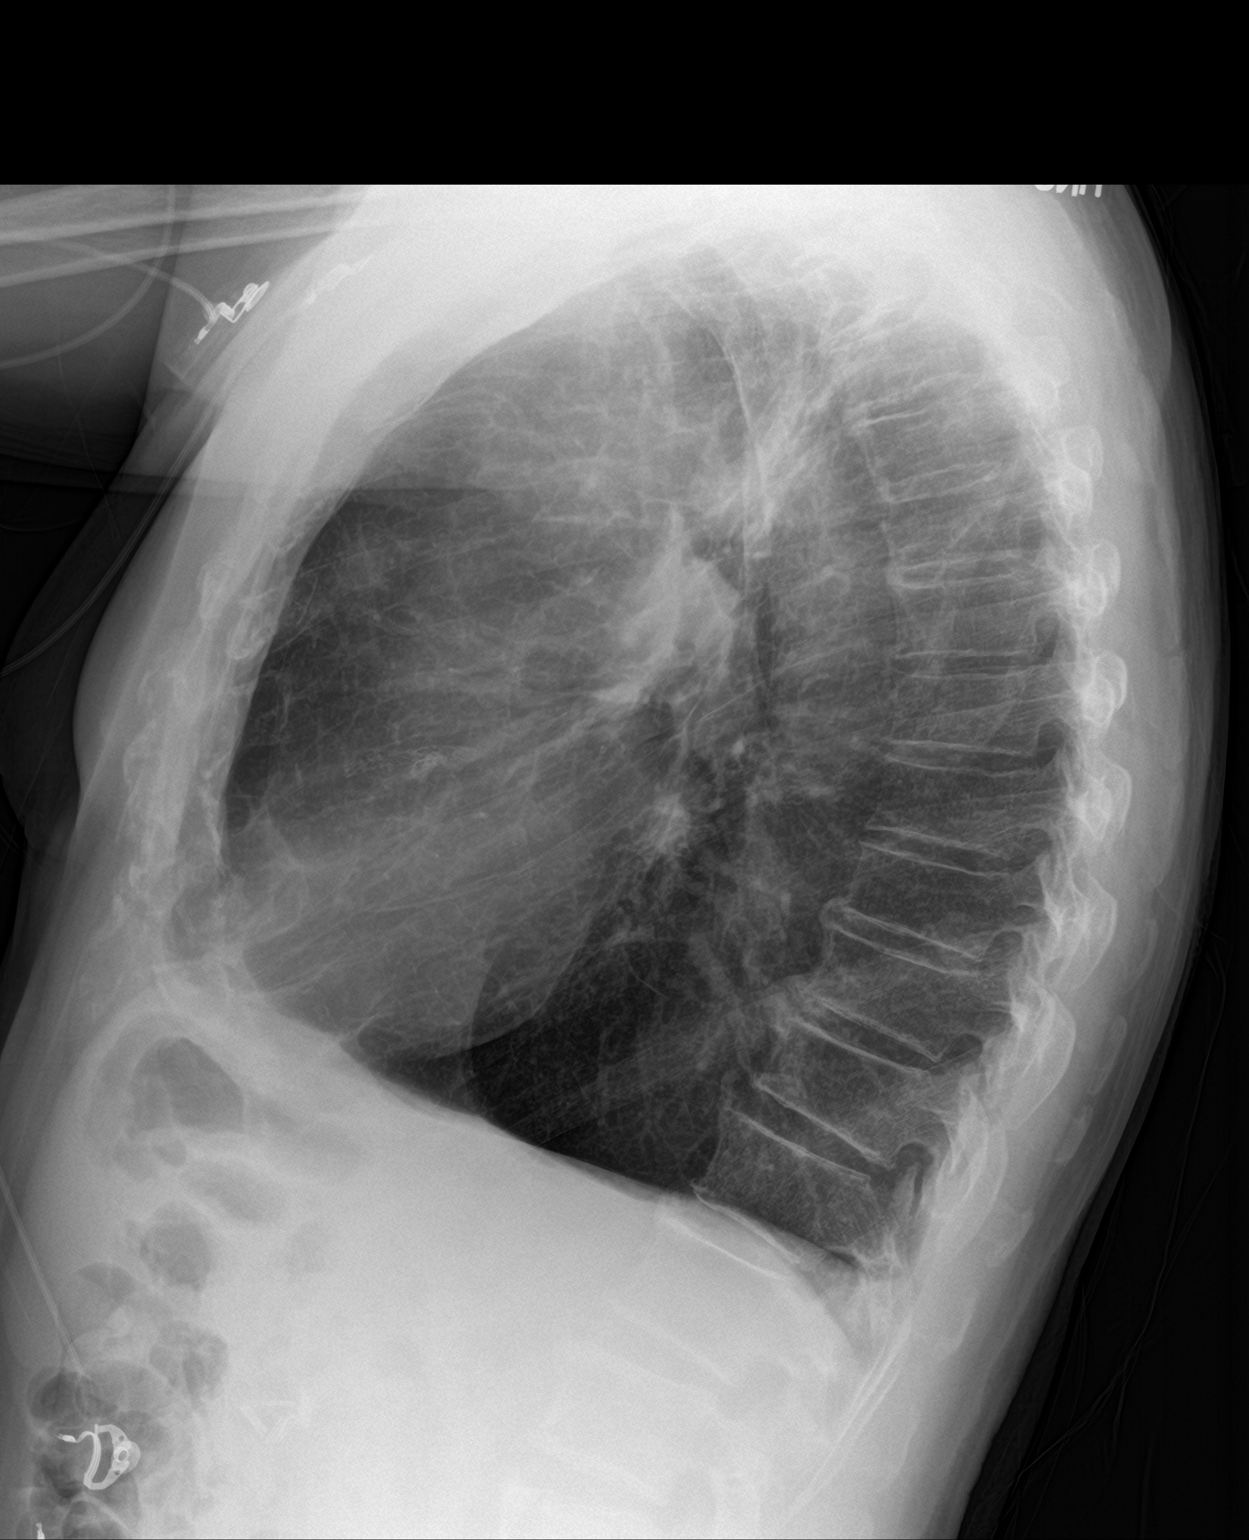

[2 of 2 positions shown; findings below may reference images not displayed]

FINDINGS: Mediastinum and hilar structures normal. Heart size normal. COPD. No
focal infiltrate. Right apical and bibasilar pleural-parenchymal
thickening again noted consistent with scarring. No pleural effusion
or pneumothorax. Degenerative changes thoracic spine with stable
mild upper thoracic vertebral body compression. Prior
cervicothoracic spine fusion .
IMPRESSION: COPD. Right apical and bibasilar pleural-parenchymal scarring. No
acute pulmonary disease noted.

## 2018-08-08 DIAGNOSIS — J440 Chronic obstructive pulmonary disease with acute lower respiratory infection: Secondary | ICD-10-CM | POA: Diagnosis not present

## 2018-08-09 ENCOUNTER — Telehealth (HOSPITAL_COMMUNITY): Payer: Self-pay | Admitting: *Deleted

## 2018-08-20 DIAGNOSIS — R0603 Acute respiratory distress: Secondary | ICD-10-CM | POA: Diagnosis not present

## 2018-08-20 DIAGNOSIS — E860 Dehydration: Secondary | ICD-10-CM | POA: Diagnosis not present

## 2018-08-20 DIAGNOSIS — J209 Acute bronchitis, unspecified: Secondary | ICD-10-CM | POA: Diagnosis not present

## 2018-08-20 DIAGNOSIS — J9801 Acute bronchospasm: Secondary | ICD-10-CM | POA: Diagnosis not present

## 2018-09-02 ENCOUNTER — Ambulatory Visit: Payer: PPO | Admitting: Pulmonary Disease

## 2018-09-08 DIAGNOSIS — J449 Chronic obstructive pulmonary disease, unspecified: Secondary | ICD-10-CM | POA: Diagnosis not present

## 2018-09-08 DIAGNOSIS — J440 Chronic obstructive pulmonary disease with acute lower respiratory infection: Secondary | ICD-10-CM | POA: Diagnosis not present

## 2018-09-08 DIAGNOSIS — E7800 Pure hypercholesterolemia, unspecified: Secondary | ICD-10-CM | POA: Diagnosis not present

## 2018-09-08 DIAGNOSIS — F329 Major depressive disorder, single episode, unspecified: Secondary | ICD-10-CM | POA: Diagnosis not present

## 2018-09-08 DIAGNOSIS — I208 Other forms of angina pectoris: Secondary | ICD-10-CM | POA: Diagnosis not present

## 2018-09-08 DIAGNOSIS — R54 Age-related physical debility: Secondary | ICD-10-CM | POA: Diagnosis not present

## 2018-09-08 DIAGNOSIS — I2510 Atherosclerotic heart disease of native coronary artery without angina pectoris: Secondary | ICD-10-CM | POA: Diagnosis not present

## 2018-09-28 ENCOUNTER — Other Ambulatory Visit: Payer: Self-pay | Admitting: Pulmonary Disease

## 2018-09-30 DIAGNOSIS — J209 Acute bronchitis, unspecified: Secondary | ICD-10-CM | POA: Diagnosis not present

## 2018-09-30 DIAGNOSIS — J441 Chronic obstructive pulmonary disease with (acute) exacerbation: Secondary | ICD-10-CM | POA: Diagnosis not present

## 2018-09-30 DIAGNOSIS — J9801 Acute bronchospasm: Secondary | ICD-10-CM | POA: Diagnosis not present

## 2018-09-30 DIAGNOSIS — R0603 Acute respiratory distress: Secondary | ICD-10-CM | POA: Diagnosis not present

## 2018-10-05 DIAGNOSIS — J449 Chronic obstructive pulmonary disease, unspecified: Secondary | ICD-10-CM | POA: Diagnosis not present

## 2018-10-05 DIAGNOSIS — I208 Other forms of angina pectoris: Secondary | ICD-10-CM | POA: Diagnosis not present

## 2018-10-05 DIAGNOSIS — R54 Age-related physical debility: Secondary | ICD-10-CM | POA: Diagnosis not present

## 2018-10-05 DIAGNOSIS — F329 Major depressive disorder, single episode, unspecified: Secondary | ICD-10-CM | POA: Diagnosis not present

## 2018-10-05 DIAGNOSIS — I2510 Atherosclerotic heart disease of native coronary artery without angina pectoris: Secondary | ICD-10-CM | POA: Diagnosis not present

## 2018-10-09 DIAGNOSIS — J440 Chronic obstructive pulmonary disease with acute lower respiratory infection: Secondary | ICD-10-CM | POA: Diagnosis not present

## 2018-10-26 DIAGNOSIS — F329 Major depressive disorder, single episode, unspecified: Secondary | ICD-10-CM | POA: Diagnosis not present

## 2018-10-26 DIAGNOSIS — J449 Chronic obstructive pulmonary disease, unspecified: Secondary | ICD-10-CM | POA: Diagnosis not present

## 2018-10-26 DIAGNOSIS — I208 Other forms of angina pectoris: Secondary | ICD-10-CM | POA: Diagnosis not present

## 2018-10-26 DIAGNOSIS — R54 Age-related physical debility: Secondary | ICD-10-CM | POA: Diagnosis not present

## 2018-10-26 DIAGNOSIS — I2510 Atherosclerotic heart disease of native coronary artery without angina pectoris: Secondary | ICD-10-CM | POA: Diagnosis not present

## 2018-11-04 DIAGNOSIS — Z139 Encounter for screening, unspecified: Secondary | ICD-10-CM | POA: Diagnosis not present

## 2018-11-04 DIAGNOSIS — R21 Rash and other nonspecific skin eruption: Secondary | ICD-10-CM | POA: Diagnosis not present

## 2018-11-07 DIAGNOSIS — J440 Chronic obstructive pulmonary disease with acute lower respiratory infection: Secondary | ICD-10-CM | POA: Diagnosis not present

## 2018-12-10 DEATH — deceased

## 2018-12-20 ENCOUNTER — Telehealth: Payer: Self-pay | Admitting: Cardiology

## 2018-12-20 NOTE — Telephone Encounter (Signed)
New Message            FYI Patient's daughter is calling in today to let us know that her father has passed away,he died on 05/02/24died at his home in [facility].
